# Patient Record
Sex: Female | Born: 1955 | Race: White | Hispanic: No | Marital: Married | Smoking: Never smoker
Health system: Southern US, Community
[De-identification: ages and names within clinical notes are randomized; demographics above are authoritative.]

## PROBLEM LIST (undated history)

## (undated) DIAGNOSIS — K589 Irritable bowel syndrome without diarrhea: Secondary | ICD-10-CM

## (undated) DIAGNOSIS — K5730 Diverticulosis of large intestine without perforation or abscess without bleeding: Secondary | ICD-10-CM

## (undated) DIAGNOSIS — Z800 Family history of malignant neoplasm of digestive organs: Secondary | ICD-10-CM

## (undated) HISTORY — PX: HEMORRHOID SURGERY: SHX153

## (undated) HISTORY — DX: Irritable bowel syndrome, unspecified: K58.9

## (undated) HISTORY — PX: OOPHORECTOMY: SHX86

## (undated) HISTORY — PX: FOOT SURGERY: SHX648

---

## 1998-01-19 ENCOUNTER — Encounter: Admission: RE | Admit: 1998-01-19 | Discharge: 1998-01-19 | Payer: Self-pay | Admitting: Sports Medicine

## 1998-02-05 ENCOUNTER — Encounter: Admission: RE | Admit: 1998-02-05 | Discharge: 1998-05-06 | Payer: Self-pay

## 1999-01-27 ENCOUNTER — Other Ambulatory Visit: Admission: RE | Admit: 1999-01-27 | Discharge: 1999-01-27 | Payer: Self-pay | Admitting: Obstetrics and Gynecology

## 1999-05-24 ENCOUNTER — Ambulatory Visit (HOSPITAL_COMMUNITY): Admission: RE | Admit: 1999-05-24 | Discharge: 1999-05-24 | Payer: Self-pay | Admitting: Obstetrics and Gynecology

## 1999-05-24 ENCOUNTER — Encounter: Payer: Self-pay | Admitting: Obstetrics and Gynecology

## 1999-05-27 ENCOUNTER — Ambulatory Visit (HOSPITAL_COMMUNITY): Admission: RE | Admit: 1999-05-27 | Discharge: 1999-05-27 | Payer: Self-pay | Admitting: Obstetrics and Gynecology

## 1999-05-27 ENCOUNTER — Encounter: Payer: Self-pay | Admitting: Obstetrics and Gynecology

## 1999-05-28 ENCOUNTER — Ambulatory Visit (HOSPITAL_COMMUNITY): Admission: RE | Admit: 1999-05-28 | Discharge: 1999-05-28 | Payer: Self-pay | Admitting: Internal Medicine

## 1999-05-28 ENCOUNTER — Encounter: Payer: Self-pay | Admitting: Internal Medicine

## 1999-06-30 HISTORY — PX: LAPAROTOMY: SHX154

## 1999-07-12 ENCOUNTER — Ambulatory Visit (HOSPITAL_COMMUNITY): Admission: RE | Admit: 1999-07-12 | Discharge: 1999-07-12 | Payer: Self-pay | Admitting: Obstetrics and Gynecology

## 1999-07-12 ENCOUNTER — Encounter (INDEPENDENT_AMBULATORY_CARE_PROVIDER_SITE_OTHER): Payer: Self-pay

## 1999-10-15 ENCOUNTER — Encounter: Admission: RE | Admit: 1999-10-15 | Discharge: 2000-01-13 | Payer: Self-pay | Admitting: Anesthesiology

## 1999-11-23 ENCOUNTER — Encounter: Payer: Self-pay | Admitting: Internal Medicine

## 2000-01-28 ENCOUNTER — Encounter: Payer: Self-pay | Admitting: Internal Medicine

## 2000-01-28 ENCOUNTER — Ambulatory Visit (HOSPITAL_COMMUNITY): Admission: RE | Admit: 2000-01-28 | Discharge: 2000-01-28 | Payer: Self-pay | Admitting: Internal Medicine

## 2000-02-16 ENCOUNTER — Encounter: Payer: Self-pay | Admitting: Gastroenterology

## 2000-02-16 ENCOUNTER — Ambulatory Visit (HOSPITAL_COMMUNITY): Admission: RE | Admit: 2000-02-16 | Discharge: 2000-02-16 | Payer: Self-pay | Admitting: Gastroenterology

## 2000-02-23 ENCOUNTER — Encounter: Payer: Self-pay | Admitting: Internal Medicine

## 2000-02-23 ENCOUNTER — Ambulatory Visit (HOSPITAL_COMMUNITY): Admission: RE | Admit: 2000-02-23 | Discharge: 2000-02-23 | Payer: Self-pay | Admitting: Internal Medicine

## 2000-04-07 ENCOUNTER — Other Ambulatory Visit: Admission: RE | Admit: 2000-04-07 | Discharge: 2000-04-07 | Payer: Self-pay | Admitting: Obstetrics and Gynecology

## 2000-05-03 ENCOUNTER — Encounter: Payer: Self-pay | Admitting: Internal Medicine

## 2000-05-03 ENCOUNTER — Ambulatory Visit (HOSPITAL_COMMUNITY): Admission: RE | Admit: 2000-05-03 | Discharge: 2000-05-03 | Payer: Self-pay | Admitting: Internal Medicine

## 2000-07-03 ENCOUNTER — Ambulatory Visit (HOSPITAL_COMMUNITY): Admission: RE | Admit: 2000-07-03 | Discharge: 2000-07-03 | Payer: Self-pay | Admitting: Obstetrics and Gynecology

## 2000-07-03 ENCOUNTER — Encounter: Payer: Self-pay | Admitting: Obstetrics and Gynecology

## 2001-04-05 ENCOUNTER — Other Ambulatory Visit: Admission: RE | Admit: 2001-04-05 | Discharge: 2001-04-05 | Payer: Self-pay | Admitting: Obstetrics and Gynecology

## 2001-07-06 ENCOUNTER — Encounter: Payer: Self-pay | Admitting: Obstetrics and Gynecology

## 2001-07-06 ENCOUNTER — Encounter: Admission: RE | Admit: 2001-07-06 | Discharge: 2001-07-06 | Payer: Self-pay | Admitting: Family Medicine

## 2001-07-06 ENCOUNTER — Ambulatory Visit (HOSPITAL_COMMUNITY): Admission: RE | Admit: 2001-07-06 | Discharge: 2001-07-06 | Payer: Self-pay | Admitting: Obstetrics and Gynecology

## 2001-08-29 HISTORY — PX: STAPEDECTOMY: SHX2435

## 2001-08-29 HISTORY — PX: ABDOMINAL HYSTERECTOMY: SHX81

## 2001-11-26 ENCOUNTER — Observation Stay (HOSPITAL_COMMUNITY): Admission: RE | Admit: 2001-11-26 | Discharge: 2001-11-27 | Payer: Self-pay | Admitting: Obstetrics and Gynecology

## 2001-11-26 ENCOUNTER — Encounter (INDEPENDENT_AMBULATORY_CARE_PROVIDER_SITE_OTHER): Payer: Self-pay | Admitting: Specialist

## 2002-04-22 ENCOUNTER — Encounter: Payer: Self-pay | Admitting: Obstetrics and Gynecology

## 2002-04-22 ENCOUNTER — Encounter: Admission: RE | Admit: 2002-04-22 | Discharge: 2002-04-22 | Payer: Self-pay | Admitting: Obstetrics and Gynecology

## 2002-08-06 ENCOUNTER — Ambulatory Visit (HOSPITAL_COMMUNITY): Admission: RE | Admit: 2002-08-06 | Discharge: 2002-08-06 | Payer: Self-pay | Admitting: Obstetrics and Gynecology

## 2002-08-06 ENCOUNTER — Encounter: Payer: Self-pay | Admitting: Obstetrics and Gynecology

## 2003-01-09 ENCOUNTER — Other Ambulatory Visit: Admission: RE | Admit: 2003-01-09 | Discharge: 2003-01-09 | Payer: Self-pay | Admitting: Obstetrics and Gynecology

## 2003-05-28 ENCOUNTER — Encounter: Admission: RE | Admit: 2003-05-28 | Discharge: 2003-05-28 | Payer: Self-pay | Admitting: Internal Medicine

## 2003-05-28 ENCOUNTER — Encounter: Payer: Self-pay | Admitting: Internal Medicine

## 2003-09-03 ENCOUNTER — Ambulatory Visit (HOSPITAL_COMMUNITY): Admission: RE | Admit: 2003-09-03 | Discharge: 2003-09-03 | Payer: Self-pay | Admitting: Obstetrics and Gynecology

## 2004-02-23 ENCOUNTER — Other Ambulatory Visit: Admission: RE | Admit: 2004-02-23 | Discharge: 2004-02-23 | Payer: Self-pay | Admitting: Obstetrics and Gynecology

## 2004-07-02 ENCOUNTER — Encounter: Admission: RE | Admit: 2004-07-02 | Discharge: 2004-07-02 | Payer: Self-pay | Admitting: General Surgery

## 2004-09-17 ENCOUNTER — Ambulatory Visit (HOSPITAL_COMMUNITY): Admission: RE | Admit: 2004-09-17 | Discharge: 2004-09-17 | Payer: Self-pay | Admitting: Obstetrics and Gynecology

## 2004-11-29 ENCOUNTER — Ambulatory Visit: Payer: Self-pay | Admitting: Internal Medicine

## 2005-03-02 ENCOUNTER — Other Ambulatory Visit: Admission: RE | Admit: 2005-03-02 | Discharge: 2005-03-02 | Payer: Self-pay | Admitting: Obstetrics and Gynecology

## 2005-05-04 ENCOUNTER — Ambulatory Visit: Payer: Self-pay | Admitting: Sports Medicine

## 2005-05-04 ENCOUNTER — Encounter: Admission: RE | Admit: 2005-05-04 | Discharge: 2005-05-04 | Payer: Self-pay | Admitting: Sports Medicine

## 2005-05-24 ENCOUNTER — Ambulatory Visit: Payer: Self-pay | Admitting: Sports Medicine

## 2005-09-28 ENCOUNTER — Ambulatory Visit (HOSPITAL_COMMUNITY): Admission: RE | Admit: 2005-09-28 | Discharge: 2005-09-28 | Payer: Self-pay | Admitting: Obstetrics and Gynecology

## 2005-12-09 ENCOUNTER — Ambulatory Visit: Payer: Self-pay | Admitting: Internal Medicine

## 2006-07-24 ENCOUNTER — Ambulatory Visit: Payer: Self-pay | Admitting: Internal Medicine

## 2006-08-09 ENCOUNTER — Ambulatory Visit: Payer: Self-pay | Admitting: Internal Medicine

## 2006-08-29 LAB — HM COLONOSCOPY

## 2006-10-24 ENCOUNTER — Ambulatory Visit (HOSPITAL_COMMUNITY): Admission: RE | Admit: 2006-10-24 | Discharge: 2006-10-24 | Payer: Self-pay | Admitting: Obstetrics and Gynecology

## 2006-12-09 ENCOUNTER — Emergency Department (HOSPITAL_COMMUNITY): Admission: EM | Admit: 2006-12-09 | Discharge: 2006-12-09 | Payer: Self-pay | Admitting: Emergency Medicine

## 2006-12-11 ENCOUNTER — Ambulatory Visit: Payer: Self-pay | Admitting: Internal Medicine

## 2006-12-15 ENCOUNTER — Encounter: Admission: RE | Admit: 2006-12-15 | Discharge: 2006-12-15 | Payer: Self-pay | Admitting: Internal Medicine

## 2006-12-20 ENCOUNTER — Ambulatory Visit: Payer: Self-pay | Admitting: Internal Medicine

## 2006-12-22 ENCOUNTER — Ambulatory Visit: Payer: Self-pay | Admitting: Sports Medicine

## 2006-12-26 ENCOUNTER — Ambulatory Visit: Payer: Self-pay | Admitting: Internal Medicine

## 2006-12-31 ENCOUNTER — Encounter: Admission: RE | Admit: 2006-12-31 | Discharge: 2006-12-31 | Payer: Self-pay | Admitting: Internal Medicine

## 2007-01-12 ENCOUNTER — Ambulatory Visit (HOSPITAL_COMMUNITY): Admission: RE | Admit: 2007-01-12 | Discharge: 2007-01-12 | Payer: Self-pay | Admitting: Internal Medicine

## 2007-02-10 ENCOUNTER — Ambulatory Visit: Payer: Self-pay | Admitting: Family Medicine

## 2007-02-28 ENCOUNTER — Ambulatory Visit: Payer: Self-pay | Admitting: Internal Medicine

## 2007-05-12 IMAGING — CT CT PELVIS W/O CM
2 of 4 series · 17 of 46 positions shown, 19 images · IV contrast (agent unspecified)
Comparison: none

CLINICAL DATA: 50 year-old female with persistent and worsening right flank pain.  History of hysterectomy.  
ABDOMEN CT WITHOUT CONTRAST:
TECHNIQUE: Multidetector CT imaging of the abdomen was performed following the standard protocol without IV contrast.
TECHNIQUE: Multidetector CT imaging of the pelvis was performed following the standard protocol without IV contrast.

[Series 2: abd/pelv w/o 5.0 b31f st · axial · non-contrast · 0.59mm/px · z∈[-420,-45]mm · 14 of 83 slices shown, 16 images]
[im 4/83  soft-tissue]
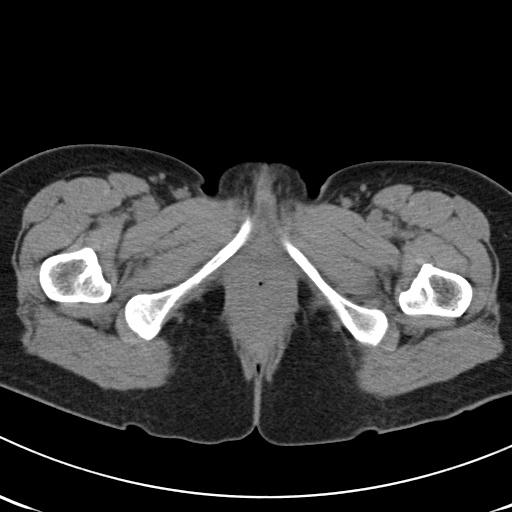
[im 4/83  bone]
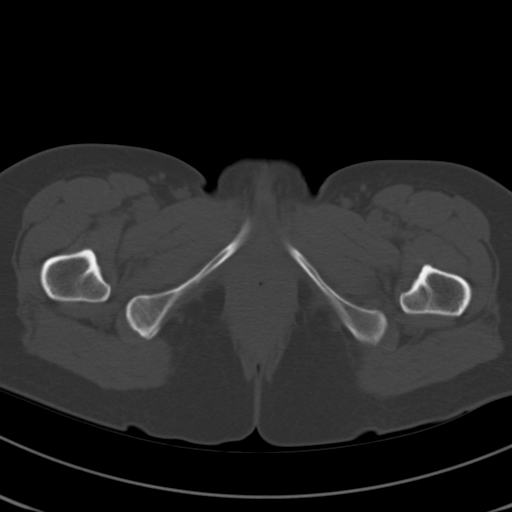
[im 11/83  soft-tissue]
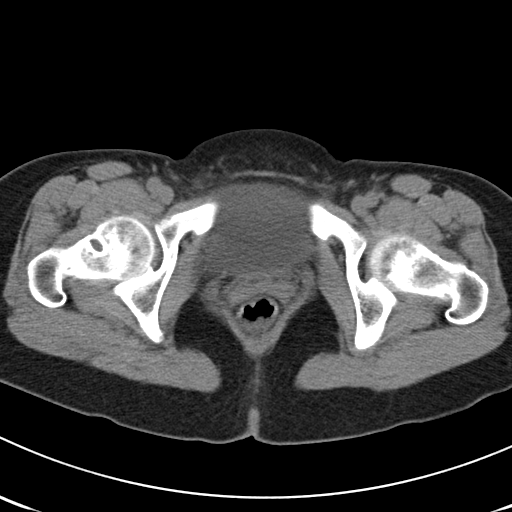
[im 18/83  soft-tissue]
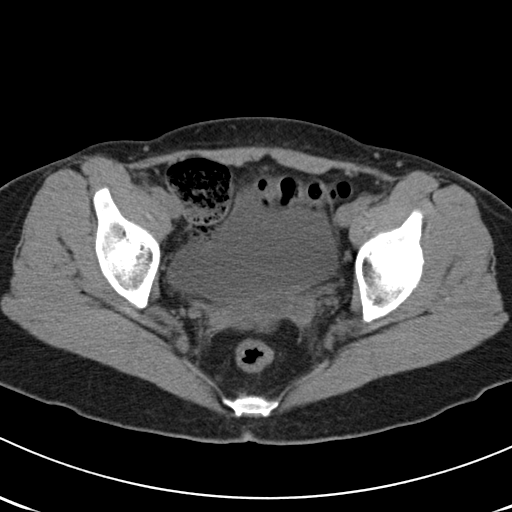
[im 21/83  soft-tissue]
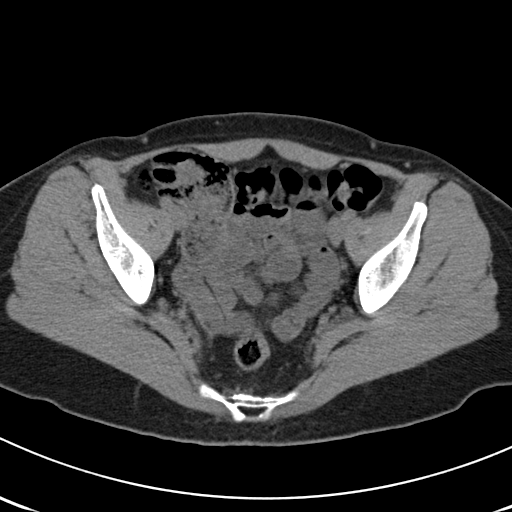
[im 28/83  soft-tissue]
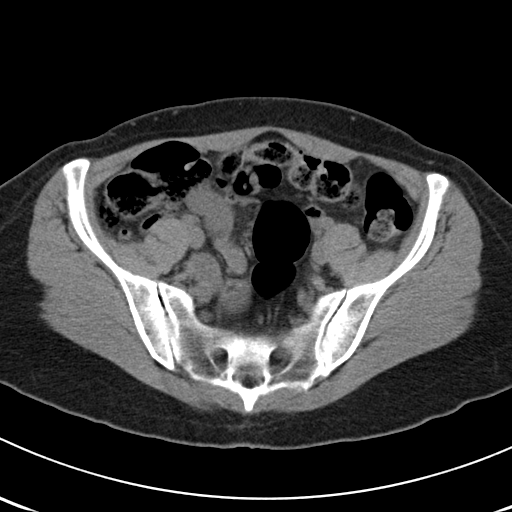
[im 35/83  soft-tissue]
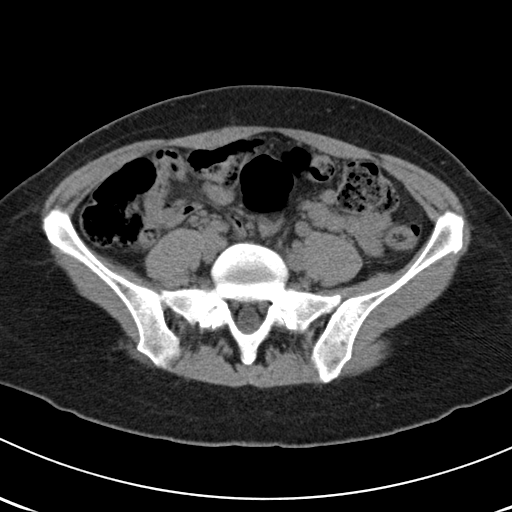
[im 38/83  soft-tissue]
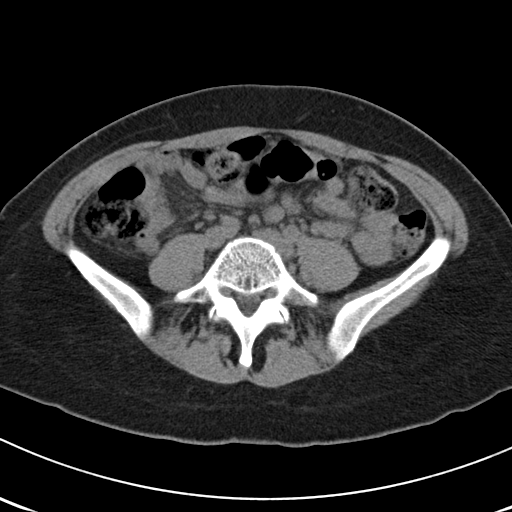
[im 45/83  soft-tissue]
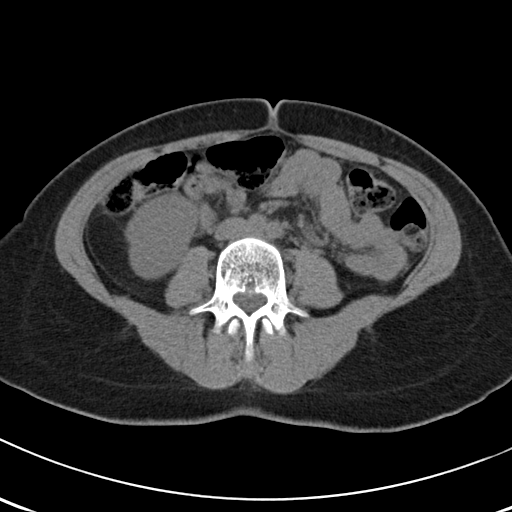
[im 48/83  soft-tissue]
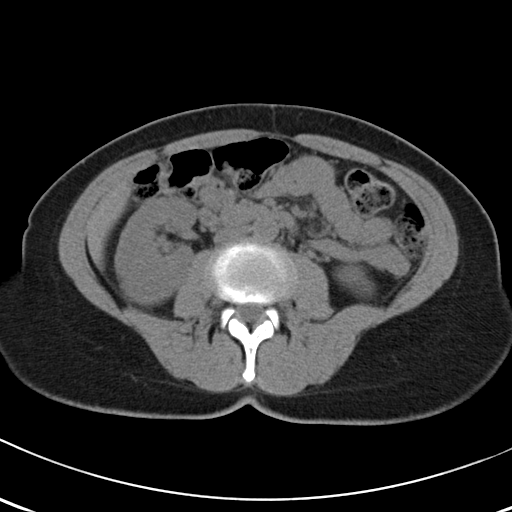
[im 48/83  bone]
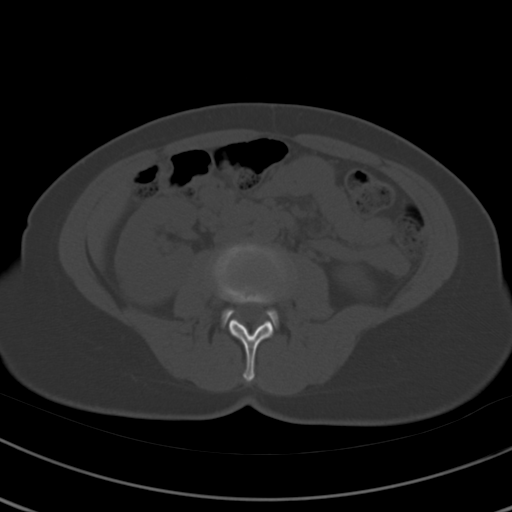
[im 55/83  soft-tissue]
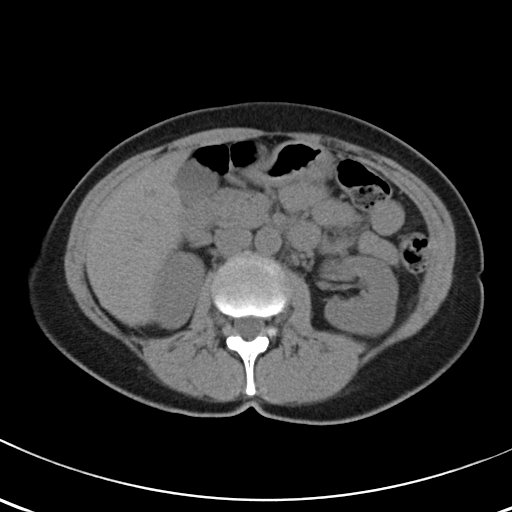
[im 62/83  soft-tissue]
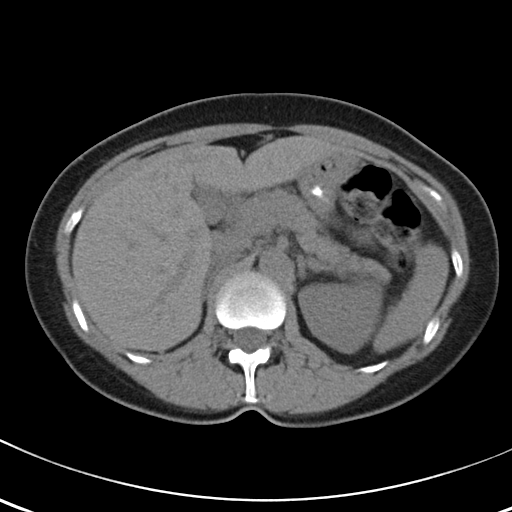
[im 65/83  soft-tissue]
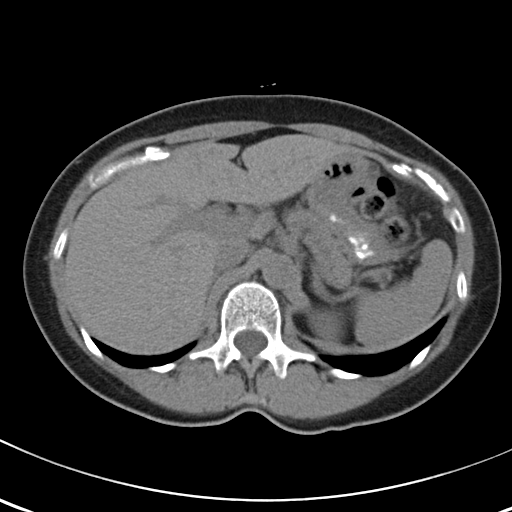
[im 72/83  soft-tissue]
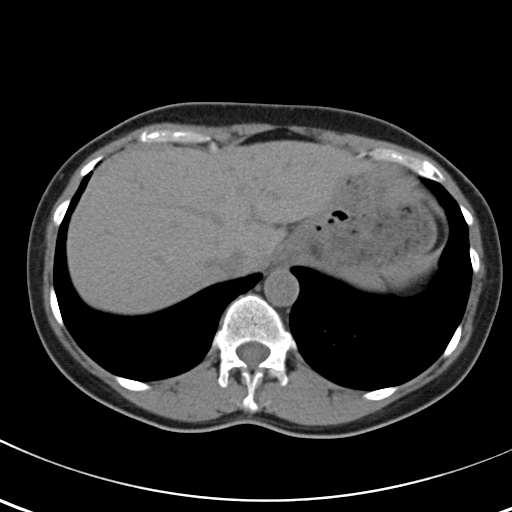
[im 79/83  soft-tissue]
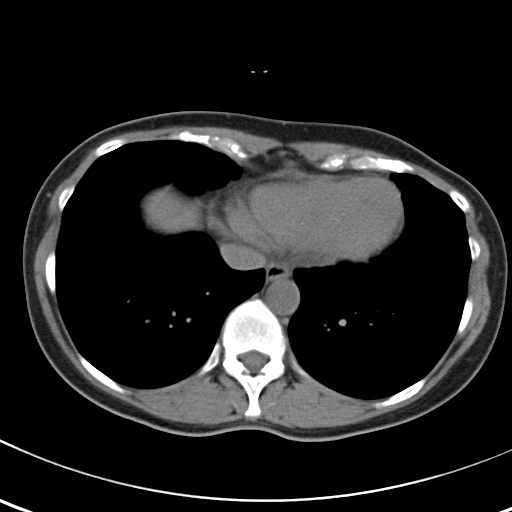

[Series 602: cor ab/pe · coronal · 0.81mm/px · 3 of 39 slices shown]
[im 13/39  soft-tissue]
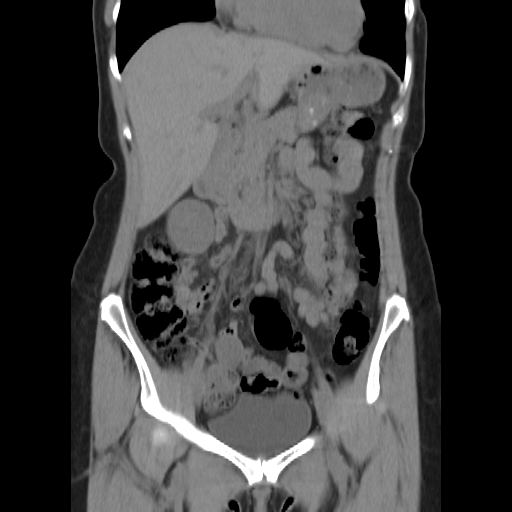
[im 17/39  soft-tissue]
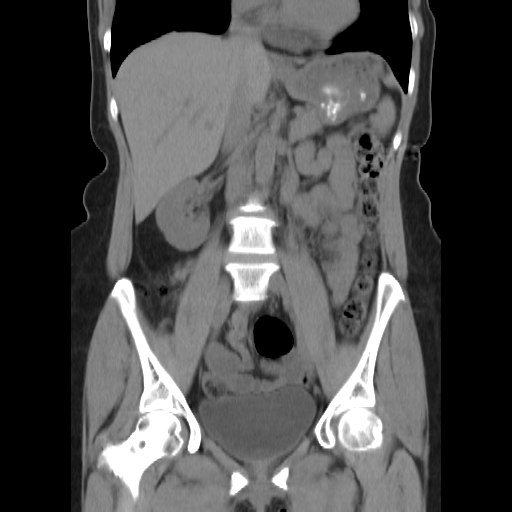
[im 22/39  soft-tissue]
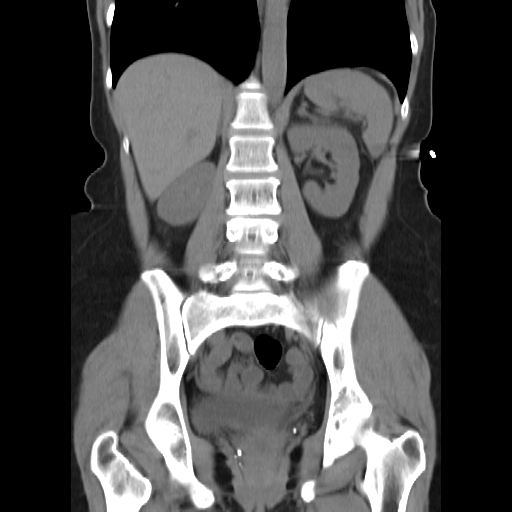

[17 of 46 positions shown; findings below may reference images not displayed]

FINDINGS: Clear lung bases.  No pericardial or pleural effusion.  Normal heart size.
In the abdomen, the kidneys demonstrate no definite hydronephrosis, obstructive uropathy, upper urinary tract stones, or ureteral dilatation.  The liver, bile ducts, gallbladder, adrenal glands, spleen, and pancreas are unremarkable for a noncontrast exam.  No acute abdominal fluid collection, ascites, free fluid, inflammation, definite adenopathy, bowel obstruction or free air.  In the right lower quadrant, the appendix is prominent in size but no significant inflammation or free fluid to suggest definite appendicitis.
IMPRESSION: 1.  No upper urinary tract calculi or acute obstructive uropathy.  
2.  No definite acute finding on noncontrast CT.
PELVIS CT WITHOUT CONTRAST:
FINDINGS: No definite distal ureteral calculus, obstruction, dilatation, or UVJ abnormality.  Patient is status post hysterectomy.  Pelvic calcifications are likely venous phleboliths.  No atherosclerotic disease.
IMPRESSION: 1.  No distal urinary tract calculi.  Status post hysterectomy.  
2.  No acute intrapelvic finding.

## 2007-07-06 ENCOUNTER — Ambulatory Visit: Payer: Self-pay | Admitting: Internal Medicine

## 2007-10-03 ENCOUNTER — Telehealth: Payer: Self-pay | Admitting: Internal Medicine

## 2007-10-04 ENCOUNTER — Ambulatory Visit: Payer: Self-pay | Admitting: Internal Medicine

## 2007-10-31 ENCOUNTER — Ambulatory Visit (HOSPITAL_COMMUNITY): Admission: RE | Admit: 2007-10-31 | Discharge: 2007-10-31 | Payer: Self-pay | Admitting: Obstetrics and Gynecology

## 2007-11-01 ENCOUNTER — Telehealth (INDEPENDENT_AMBULATORY_CARE_PROVIDER_SITE_OTHER): Payer: Self-pay | Admitting: *Deleted

## 2007-12-05 ENCOUNTER — Telehealth (INDEPENDENT_AMBULATORY_CARE_PROVIDER_SITE_OTHER): Payer: Self-pay | Admitting: *Deleted

## 2008-01-10 ENCOUNTER — Ambulatory Visit: Payer: Self-pay | Admitting: Internal Medicine

## 2008-01-10 LAB — CONVERTED CEMR LAB: Rapid Strep: NEGATIVE

## 2008-01-14 ENCOUNTER — Telehealth: Payer: Self-pay | Admitting: Internal Medicine

## 2008-05-09 ENCOUNTER — Ambulatory Visit: Payer: Self-pay | Admitting: Sports Medicine

## 2008-06-05 ENCOUNTER — Ambulatory Visit: Payer: Self-pay | Admitting: Internal Medicine

## 2008-06-16 ENCOUNTER — Encounter: Payer: Self-pay | Admitting: Internal Medicine

## 2008-10-21 ENCOUNTER — Telehealth: Payer: Self-pay | Admitting: Internal Medicine

## 2008-10-22 ENCOUNTER — Ambulatory Visit: Payer: Self-pay | Admitting: Internal Medicine

## 2008-10-28 ENCOUNTER — Telehealth: Payer: Self-pay | Admitting: Internal Medicine

## 2008-11-25 ENCOUNTER — Ambulatory Visit (HOSPITAL_COMMUNITY): Admission: RE | Admit: 2008-11-25 | Discharge: 2008-11-25 | Payer: Self-pay | Admitting: Obstetrics and Gynecology

## 2008-12-20 ENCOUNTER — Encounter: Payer: Self-pay | Admitting: Internal Medicine

## 2009-05-13 ENCOUNTER — Ambulatory Visit: Payer: Self-pay | Admitting: Internal Medicine

## 2009-05-13 DIAGNOSIS — M7710 Lateral epicondylitis, unspecified elbow: Secondary | ICD-10-CM | POA: Insufficient documentation

## 2009-05-28 ENCOUNTER — Ambulatory Visit: Payer: Self-pay | Admitting: Internal Medicine

## 2009-06-08 ENCOUNTER — Ambulatory Visit: Payer: Self-pay | Admitting: Internal Medicine

## 2009-07-10 ENCOUNTER — Encounter: Payer: Self-pay | Admitting: Internal Medicine

## 2009-08-10 ENCOUNTER — Encounter (INDEPENDENT_AMBULATORY_CARE_PROVIDER_SITE_OTHER): Payer: Self-pay | Admitting: *Deleted

## 2009-08-29 HISTORY — PX: ORIF ELBOW FRACTURE: SUR928

## 2009-08-29 LAB — CONVERTED CEMR LAB: Pap Smear: NORMAL

## 2009-12-10 ENCOUNTER — Ambulatory Visit: Payer: Self-pay | Admitting: Internal Medicine

## 2009-12-10 DIAGNOSIS — M549 Dorsalgia, unspecified: Secondary | ICD-10-CM | POA: Insufficient documentation

## 2009-12-21 ENCOUNTER — Telehealth: Payer: Self-pay | Admitting: Internal Medicine

## 2010-01-13 ENCOUNTER — Ambulatory Visit (HOSPITAL_COMMUNITY): Admission: RE | Admit: 2010-01-13 | Discharge: 2010-01-13 | Payer: Self-pay | Admitting: Obstetrics and Gynecology

## 2010-04-29 DIAGNOSIS — S42409A Unspecified fracture of lower end of unspecified humerus, initial encounter for closed fracture: Secondary | ICD-10-CM | POA: Insufficient documentation

## 2010-05-28 ENCOUNTER — Telehealth: Payer: Self-pay | Admitting: Internal Medicine

## 2010-06-26 ENCOUNTER — Emergency Department (HOSPITAL_COMMUNITY): Admission: EM | Admit: 2010-06-26 | Discharge: 2010-06-26 | Payer: Self-pay | Admitting: Emergency Medicine

## 2010-07-14 ENCOUNTER — Ambulatory Visit: Payer: Self-pay | Admitting: Internal Medicine

## 2010-07-26 ENCOUNTER — Telehealth: Payer: Self-pay | Admitting: Internal Medicine

## 2010-07-29 LAB — HM PAP SMEAR

## 2010-08-11 ENCOUNTER — Ambulatory Visit: Payer: Self-pay | Admitting: Internal Medicine

## 2010-08-11 ENCOUNTER — Encounter: Payer: Self-pay | Admitting: Internal Medicine

## 2010-09-15 ENCOUNTER — Encounter: Payer: Self-pay | Admitting: Internal Medicine

## 2010-09-19 ENCOUNTER — Encounter: Payer: Self-pay | Admitting: Internal Medicine
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
## 2010-09-28 NOTE — Progress Notes (Signed)
Summary: wants bone density  Phone Note Call from Patient Call back at Home Phone [phone]   Caller: Patient---live call Summary of Call: Wants an order to have a bone density test. gyn normal does this, but is unable to get in before year is up. Initial call taken by: Warnell Forester,  July 26, 2010 1:46 PM  Follow-up for Phone Call        ok Follow-up by: Birdie Sons MD,  July 26, 2010 2:28 PM  Additional Follow-up for Phone Call Additional follow up Details #1::        will go on 08-11-2010 Additional Follow-up by: Warnell Forester,  July 26, 2010 2:45 PM

## 2010-09-28 NOTE — Assessment & Plan Note (Signed)
Summary: consult re: tendonitis/pt req cortisone inj/cjr   Vital Signs:  Patient profile:   55 year old female Weight:      118 pounds Temp:     98.1 degrees F oral Pulse rate:   60 / minute Pulse rhythm:   regular Resp:     12 per minute BP sitting:   120 / 76  (left arm) Cuff size:   regular  Vitals Entered By: Gladis Riffle, RN (December 10, 2009 11:12 AM) CC: discuss right elbow tendonitis and possible steroid inj,plus  lower back pain Is Patient Diabetic? No Comments needs refills all but anusol cream   CC:  discuss right elbow tendonitis and possible steroid inj and plus  lower back pain.  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 Once Daily 2)  Calcium Citrate Malate-Vit D 250-100 Mg-Unit Tabs (Calcium-Vitamin D) .... Three Times A Day 3)  Black Cohosh 40 Mg Caps (Black Cohosh) .... Two Times A Day 4)  Anusol-Hc 2.5 % Crea (Hydrocortisone) .... Apply To Rectum Prn 5)  Magnesium Oxide 250 Mg Tabs (Magnesium Oxide) .... Once Daily 6)  Centrum  Tabs (Multiple Vitamins-Minerals) .... Take 1 Tablet By Mouth Once A Day  Allergies (verified): No Known Drug Allergies  Past History:  Past Medical History: Last updated: 03/08/2007 endometriosis  2000 IBS  Past Surgical History: Last updated: 03/08/2007 Hemorrhoidectomy  "growth on hemorroid"  1997 Hysterectomy  2003 Laparotomy-exploratory, for endometriosis, Lupron  11/00 Oophorectomy  2003 L ear stapedectomy  2003  Risk Factors: Smoking Status: never (12/10/2009)  Physical Exam  General:  alert and  well-developed.   Msk:  full range of motion of both elbows.  Tenderness to the right lateral epicondyle to palpation. Extremities:  full range of motion of both hips.  Deep tendon reflexes are intact bilaterally in the lower extremity.   Impression & Recommendations:  Problem # 1:  BACK PAIN (ICD-724.5) exercise reviewed exercises with the patient.  She will concentrate on stretching.  May need further evaluation with physical therapy.  Problem # 2:  LATERAL EPICONDYLITIS (ICD-726.32)  injection---discussed  (injection) she has had previous success with injection.  She requests the same. risks and benefits discussed.  She gives verbal consent to proceed.  The area was prepped and draped in a sterile fashion.  1% lidocaine for anesthesia.  20 mg Depo-Medrol injected around the lateral epicondyle at the point of maximal tenderness.    Orders: Joint Aspirate / Injection, Intermediate (20605)  Complete Medication List: 1)  Zyrtec Allergy 10 Mg Tabs (Cetirizine hcl) .... Once daily 2)  Calcium Citrate Malate-vit D 250-100 Mg-unit Tabs (Calcium-vitamin d) .... Three times a day 3)  Black Cohosh 40 Mg Caps (Black cohosh) .... Two times a day 4)  Anusol-hc 2.5 % Crea (Hydrocortisone) .... Apply to rectum prn 5)  Magnesium Oxide 250 Mg Tabs (Magnesium oxide) .... Once daily 6)  Centrum Tabs (Multiple vitamins-minerals) .... Take 1 tablet by mouth once a day  Other Orders: Tdap => 41yrs IM (16109) Admin 1st Vaccine (60454)  Preventive Care Screening  Colonoscopy:    Date:  08/30/2007  Next Due:  08/2012    Results:  normal-pts   Mammogram:    Date:  08/29/2009    Next Due:  08/2011    Results:  normal-pt's report   Pap Smear:    Date:  08/29/2009    Next Due:  08/2012    Results:  normal-pt's report   Prescriptions: CENTRUM  TABS (MULTIPLE VITAMINS-MINERALS) Take 1 tablet by mouth once a day  #90 x 1 year   Entered by:   Gladis Riffle, RN   Authorized by:   Birdie Sons MD    Signed by:   Gladis Riffle, RN on 12/10/2009   Method used:   Print then Give to Patient   RxID:   [id number] MAGNESIUM OXIDE 250 MG TABS (MAGNESIUM OXIDE) once daily  #90 x 1 year   Entered by:   Gladis Riffle, RN   Authorized by:   Birdie Sons MD   Signed by:   Gladis Riffle, RN on 12/10/2009   Method used:   Print then Give to Patient   RxID:   [id number] BLACK COHOSH 40 MG CAPS (BLACK COHOSH) two times a day  #180 x 1 year   Entered by:   Gladis Riffle, RN   Authorized by:   Birdie Sons MD   Signed by:   Gladis Riffle, RN on 12/10/2009   Method used:   Print then Give to Patient   RxID:   [id number] CALCIUM CITRATE MALATE-VIT D 250-100 MG-UNIT TABS (CALCIUM-VITAMIN D) three times a day  #270 x 1 year   Entered by:   Gladis Riffle, RN   Authorized by:   Birdie Sons MD   Signed by:   Gladis Riffle, RN on 12/10/2009   Method used:   Print then Give to Patient   RxID:   [id number] ZYRTEC ALLERGY 10 MG TABS (CETIRIZINE HCL) once daily  #90 x 1 year   Entered by:   Gladis Riffle, RN   Authorized by:   Birdie Sons MD   Signed by:   Gladis Riffle, RN on 12/10/2009   Method used:   Print then Give to Patient   RxID:   [id number]    Immunizations Administered:  Tetanus Vaccine:    Vaccine Type: Tdap    Site: left deltoid    Mfr: GlaxoSmithKline    Dose: 0.5 ml    Route: IM    Given by: Gladis Riffle, RN    Exp. Date: 11/21/2011    Lot #: [id number]

## 2010-09-28 NOTE — Assessment & Plan Note (Signed)
Summary: flu shot/cjr   Nurse Visit   Allergies: No Known Drug Allergies  Review of Systems       Flu Vaccine Consent Questions     Do you have a history of severe allergic reactions to this vaccine? no    Any prior history of allergic reactions to egg and/or gelatin? no    Do you have a sensitivity to the preservative Thimersol? no    Do you have a past history of Guillan-Barre Syndrome? no    Do you currently have an acute febrile illness? no    Have you ever had a severe reaction to latex? no    Vaccine information given and explained to patient? yes    Are you currently pregnant? no    Lot Number:[id number]   Exp Date:02/25/2010   Site Given  Left Deltoid IM    Orders Added: 1)  Admin 1st Vaccine [90471] 2)  Flu Vaccine 57yrs + [16109]

## 2010-09-28 NOTE — Progress Notes (Signed)
Summary: need papers faxed  Phone Note Call from Patient Call back at Home Phone [phone]   Caller: Patient--live call Summary of Call: need excercises for her back. Dr Cato Mulligan was going to give them to her last week at her ov. Fax them to her at [phone]. Initial call taken by: Warnell Forester,  December 21, 2009 11:07 AM  Follow-up for Phone Call        done. Follow-up by: Gladis Riffle, RN,  December 21, 2009 1:12 PM

## 2010-09-28 NOTE — Letter (Signed)
Summary: Health Screening Report  Health Screening Report   Imported By: Maryln Gottron 12/11/2009 14:24:55  _____________________________________________________________________  External Attachment:    Type:   Image     Comment:   External Document

## 2010-09-30 NOTE — Miscellaneous (Signed)
Summary: BONE DENSITY  Clinical Lists Changes  Orders: Added new Test order of T-Bone Densitometry (77080) - Signed Added new Test order of T-Lumbar Vertebral Assessment (77082) - Signed 

## 2010-10-06 NOTE — Letter (Signed)
Summary: [facility]   Imported By: Maryln Gottron 09/30/2010 09:24:05  _____________________________________________________________________  External Attachment:    Type:   Image     Comment:   External Document

## 2011-01-05 ENCOUNTER — Other Ambulatory Visit (HOSPITAL_COMMUNITY): Payer: Self-pay | Admitting: Obstetrics and Gynecology

## 2011-01-05 DIAGNOSIS — Z1231 Encounter for screening mammogram for malignant neoplasm of breast: Secondary | ICD-10-CM

## 2011-01-14 NOTE — Discharge Summary (Signed)
[facility]  Patient:    Monique Moore, Monique Moore Visit Number: [id number] MRN: [id number]          Service Type: DSU Location: 910A 9141 [id number] Attending Physician:  Soledad Gerlach Dictated by:   Guy Sandifer Arleta Creek, M.D. Admit Date:  11/26/2001 Disc. Date: 11/27/01                             Discharge Summary  ADMISSION DIAGNOSES: 1. Endometriosis. 2. Uterine fibroids.  DISCHARGE DIAGNOSES: 1. Endometriosis. 2. Uterine fibroids.  PROCEDURE:  On 11/26/01, laparoscopically assisted vaginal hysterectomy with bilateral salpingo-oophorectomy.  SURGEON:  Guy Sandifer. Arleta Creek, M.D.  ASSISTANT:  Marcelle Overlie, M.D.  ANESTHESIA:  General with endotracheal intubation.  ESTIMATED BLOOD LOSS:  150 cc. Dictated by:   Guy Sandifer Arleta Creek, M.D. Attending Physician:  Soledad Gerlach DD:  11/27/01 TD:  11/27/01 Job: [id number]

## 2011-01-14 NOTE — Discharge Summary (Signed)
[facility]  Patient:    Monique Moore, Monique Moore Visit Number: [id number] MRN: [id number]          Service Type: DSU Location: 910A 9141 01 Attending Physician:  Soledad Gerlach Dictated by:   Guy Sandifer Arleta Creek, M.D. Admit Date:  11/26/2001 Disc. Date: 11/27/01                             Discharge Summary  ADMISSION DIAGNOSES: 1. Endometriosis. 2. Uterine fibroids.  DISCHARGE DIAGNOSES: 1. Endometriosis. 2. Uterine fibroids.  PROCEDURE:  On 11/26/01, laparoscopically assisted vaginal hysterectomy with bilateral salpingo-oophorectomy.  REASON FOR ADMISSION:  The patient is a 55 year old married white female, G2, P2, with known uterine leiomyomata and endometriosis.  Details have been dictated in the history and physical.  She is admitted for surgical management.  HOSPITAL COURSE:  The patient undergoes the above procedure without complication.  Estimated blood loss is 150 cc.  On the evening of surgery, she is nauseated with liquids.  She is able to munch ice chips.  Vital signs remain stable, and she is afebrile.  Urine output is clear.  Her abdomen is soft and flat with somewhat reduced, but positive bowel sounds in all four quadrants.  Her nausea responds to medication.  On the day of discharge, the patient reports she had nausea through the night, but today is feeling much better.  She is tolerating a liquid tray, passing flatus, ambulating well, and voiding.  Vital signs are stable, and she remains afebrile.  Abdomen is soft with good bowel sounds.  White count is 14.7, hemoglobin 10.7.  The plan is to discharge after a regular diet for lunch.  CONDITION ON DISCHARGE:  Good.  DIET:  Regular as tolerated.  ACTIVITY:  No lifting, no operation of automobiles, no vaginal entry.  She is to call the office for problems, including, but not limited to temperature of 101 or greater, heavy vaginal bleeding, persistent nausea  or vomiting, or increasing pain.  DISCHARGE MEDICATIONS: 1. Percocet 5/325 mg #30 one or two p.o. q.6h. p.r.n. 2. Ibuprofen 600 mg p.o. q.6h. p.r.n. 3. Multivitamin daily. 4. She is to resume her Zelnorm.  FOLLOWUP:  In the office in two weeks. Dictated by:   Guy Sandifer Arleta Creek, M.D. Attending Physician:  Soledad Gerlach DD:  11/27/01 TD:  11/27/01 Job: [id number]

## 2011-01-14 NOTE — H&P (Signed)
[facility]  Patient:    Monique Moore, Monique Moore Visit Number: [id number] MRN: [id number]          Service Type: DSU Location: 910A 9141 [id number] Attending Physician:  Soledad Gerlach Dictated by:   Guy Sandifer Arleta Creek, M.D. Admit Date:  11/26/2001                           History and Physical  CHIEF COMPLAINT:              Irregular menses and pelvic pain.  HISTORY OF PRESENT ILLNESS:   The patient is a 55 year old married white female, G2, P2, with a known uterine leiomyomata and endometriosis.  She is status post laparoscopy with resection of endometriosis in November 2000. This was followed by six months of Depo-Lupron therapy.  She also has a known uterine leiomyomata measuring 1.5 cm in greatest dimension on ultrasound in January of this year.  A sonohistogram in January of this year was negative for endometrial masses; however, her menses have become progressively worse and more frequent.  After a careful discussion of the options, the patient is being admitted for laparoscopically assisted vaginal hysterectomy.  After further discussion, the patient also request bilateral salpingo-oophorectomy.  PAST MEDICAL HISTORY:         1. Endometriosis.                               2. Cervical dysplasia.                               3. Irritable bowel syndrome.                               4. Hemorrhoids.                               5. Recurrent cystitis.  PAST SURGICAL HISTORY:        1. Hemorrhoidectomy in 1997.                               2. Cystoscopy in 1982.                               3. Tonsillectomy in 1963.                               4. Laparoscopy in 2000.  MEDICATIONS:                  Zelnorm, vitamins and calcium.  ALLERGIES:                    CODEINE.  OBSTETRIC HISTORY:            Vaginal delivery x 2.  SOCIAL HISTORY:               The patient consumes alcohol on a social basis. Denies tobacco or drug  abuse.  REVIEW OF SYSTEMS:            As above.  FAMILY HISTORY:  Positive for chronic hypertension in mother, diabetes in father and brother, cerebrovascular accident in father.  PHYSICAL EXAMINATION:  VITAL SIGNS:                  Height 5 feet 3 inches.  Weight 115 pounds. Blood pressure 128/82.  NECK:                         Without thyromegaly.  LUNGS:                        Clear to auscultation.  HEART:                        Regular rate and rhythm.  BACK:                         Without CVA tenderness.  BREASTS:                      Without mass, track, or discharge.  ABDOMEN:                      Soft and nontender without masses.  PELVIC:                       Both vagina and cervix without lesion.  Uterus is upper normal size and nontender.  Adnexa is nontender without masses.  RECTAL:                       Without masses.  EXTREMITIES:                  Grossly within normal limits.  NEUROLOGICAL:                 Grossly within normal limits.  ASSESSMENT:                   Menometrorrhagia with known uterine leiomyomata and endometriosis.  PLAN:                         Laparoscopically assisted vaginal hysterectomy with bilateral salpingo-oophorectomy. Dictated by:   Guy Sandifer Arleta Creek, M.D. Attending Physician:  Soledad Gerlach DD:  11/23/01 TD:  11/23/01 Job: [id number]

## 2011-01-14 NOTE — Op Note (Signed)
[facility]  Patient:    Monique Moore, Monique Moore Visit Number: [id number] MRN: [id number]          Service Type: DSU Location: 9300 [id number] Attending Physician:  Soledad Gerlach Dictated by:   Guy Sandifer Arleta Creek, M.D. Proc. Date: 11/26/01 Admit Date:  11/26/2001                             Operative Report  PREOPERATIVE DIAGNOSES:       1. Endometriosis.                               2. Uterine fibroids.  POSTOPERATIVE DIAGNOSES:      1. Endometriosis.                               2. Uterine fibroids.  OPERATION:                    Laparoscopic-assisted vaginal hysterectomy with                               bilateral salpingo-oophorectomy.  SURGEON:                      Guy Sandifer. Arleta Creek, M.D.  ASSISTANT:                    Marcelle Overlie, M.D.  ANESTHESIA:                   General with endotracheal intubation.  ESTIMATED BLOOD LOSS:         150 cc.  INDICATIONS AND CONSENT:      This patient is a 55 year old married white female G2, P2, with known uterine leiomyomata and endometriosis.  The details are dictated in the History and Physical.  Laparoscopically-assisted vaginal hysterectomy was discussed with the patient.  Possible removal of both ovaries is discussed.  The patient states she definitely wants both ovaries removed. Potential risks and complications have been discussed including, but not limited to infection, bowel, bladder, or ureteral damage, bleeding requiring transfusion of blood products with possible transfusion reaction, HIV and hepatitis acquisition, DVT, PE, pneumonia, fistula formation, laparotomy, postoperative dyspareunia.  Issues of menopause and potential estrogen therapy or other treatments has been discussed.  All questions have been answered and consent is signed on the chart.  FINDINGS:                     Upper abdomen is normal.  Uterus is approximately six weeks in size.  Ovaries are without  adhesions bilaterally. Tubes are free bilaterally.  There is some white-type implants of endometriosis on the left pelvic side wall.  DESCRIPTION OF PROCEDURE:     The patient was taken to the operating room and placed in the dorsolithotomy position where general anesthesia was induced via endotracheal intubation.  She was then placed in the dorsolithotomy position where she was prepped abdominally and vaginally.  The bladder was straight catheterized.  A Hulka tenaculum was placed.  The uterus was manipulated and she was draped in a sterile fashion.  A small infraumbilical incision was then made and a 10-11 disposable trocar sleeve was placed on the first attempt without  difficulty.  Placement was verified with the laparoscope and no damage to surrounding structures was noted.  Pneumoperitoneum was induced to a small suprapubic and later left lower quadrant incision was made after careful transillumination and a 5 mm disposable trocar sleeves were then placed under direct visualization and without difficulty.  The above findings were noted. The cords and ureters is identified bilaterally and seemed to be clear of the area of surgery.  Then, using the 5 mm bipolar cautery cutting device through the operative laparoscope, the infundibulopelvic ligament followed by the mesosalpinx and the round ligament on the right side was taken down.  A similar procedure was carried on the left.  Good hemostasis is maintained. The midline incision is made in the vesicouterine peritoneum which is dissected bilaterally.  Good hemostasis is maintained.  Excess irrigation is removed, instruments are removed, and attention is turned to the vagina.  A weighted speculum was placed and the posterior cul-de-sac was entered sharply without difficulty.  The cervix is circumscribed with the scalpel and the mucosa is advanced sharply and bluntly.  Then, using the LigaSure instrument, the uterosacral ligament followed  by the bladder pillars are taken bilaterally.  Progressive bites were taken bilaterally above the level of the uterine vessels.  The fundus with adherent tubes and ovaries was delivered posteriorly, and the remaining pedicles are taken down with the LigaSure.  The uterosacral ligaments were then plicated to the vaginal cuff bilaterally with 0 Monocryl suture.  The uterosacral ligaments were then plicated in the midline with an additional suture.  The cuff was then closed with a figure-of-eight 0 Monocryl suture.  A Foley catheter was placed in the bladder and clear urine was noted.  Reinspection through the laparoscope reveals minor oozing at the vaginal cuff which was controlled with bipolar cautery.  Excess fluid is removed.  Pneumoperitoneum is reduced.  The inferior trocar sleeves are removed and no bleeding is noted.  Pneumoperitoneum was completely reduced.  The umbilical incision was closed with a 2-0 Vicryl suture in the deeper underlying layers with care being taken not to pick up any underlying structures.  The incisions were then injected with 0.5% plain Marcaine.  The skin edges were then reapproximated with Dermabond.  All counts are correct.  The patient is awakened and taken to the recovery room in stable condition. Dictated by:   Guy Sandifer Arleta Creek, M.D. Attending Physician:  Soledad Gerlach DD:  11/26/01 TD:  11/26/01 Job: [id number]

## 2011-01-14 NOTE — Assessment & Plan Note (Signed)
[facility]                                 ON-CALL NOTE   KARLENE, SOUTHARD                      MRN:          [id number]  DATE:12/09/2006                            DOB:          1956-03-24    Patient of Dr. Cato Mulligan   Phone#:  [phone]   Patient calling because she has severe right flank pain.  It started on  Tuesday.  It comes and goes, now it has gotten worse.  She has no fever,  chills, nausea, vomiting, or diarrhea.  She has no urinary tract  symptoms.  She has had TAH/BSO, but no other abdominal surgery.  She is  otherwise in good health.  She says her mother had symptoms similar to  this, and had to have her gallbladder removed.   Discussed clinical symptoms.  With the severe pain, it could be  gallbladder, but my suspicion would be most likely it is a kidney stone.  Advised her to come to the [facility] to be seen, get  urine, scans, etc., today.  Patient voiced understanding.     Jeffrey A. Tawanna Cooler, MD  Electronically Signed    JAT/MedQ  DD: 12/09/2006  DT: 12/09/2006  Job #: [id number]

## 2011-02-03 ENCOUNTER — Ambulatory Visit (HOSPITAL_COMMUNITY): Payer: BC Managed Care – PPO

## 2011-02-25 ENCOUNTER — Ambulatory Visit (HOSPITAL_COMMUNITY)
Admission: RE | Admit: 2011-02-25 | Discharge: 2011-02-25 | Disposition: A | Discharge status: Home / Self Care | Payer: BC Managed Care – PPO | Source: Ambulatory Visit | Attending: Obstetrics and Gynecology | Admitting: Obstetrics and Gynecology

## 2011-02-25 DIAGNOSIS — Z1231 Encounter for screening mammogram for malignant neoplasm of breast: Secondary | ICD-10-CM | POA: Insufficient documentation

## 2011-04-13 ENCOUNTER — Other Ambulatory Visit (INDEPENDENT_AMBULATORY_CARE_PROVIDER_SITE_OTHER): Payer: BC Managed Care – PPO

## 2011-04-13 DIAGNOSIS — Z0000 Encounter for general adult medical examination without abnormal findings: Secondary | ICD-10-CM

## 2011-04-13 LAB — BASIC METABOLIC PANEL
BUN: 13 mg/dL (ref 6–23)
CO2: 30 mEq/L (ref 19–32)
Calcium: 9.3 mg/dL (ref 8.4–10.5)
Creatinine, Ser: 0.8 mg/dL (ref 0.4–1.2)
GFR: 76.85 mL/min (ref >60.00)
Glucose, Bld: 103 mg/dL — ABNORMAL HIGH (ref 70–99)

## 2011-04-13 LAB — POCT URINALYSIS DIPSTICK
Bilirubin, UA: NEGATIVE
Glucose, UA: NEGATIVE
Ketones, UA: NEGATIVE
Nitrite, UA: NEGATIVE
Spec Grav, UA: 1.015
pH, UA: 8.5

## 2011-04-13 LAB — TSH: TSH: 2.46 u[IU]/mL (ref 0.35–5.50)

## 2011-04-13 LAB — CBC WITH DIFFERENTIAL/PLATELET
Basophils Absolute: 0 10*3/uL (ref 0.0–0.1)
Basophils Relative: 0.7 % (ref 0.0–3.0)
Eosinophils Absolute: 0.2 10*3/uL (ref 0.0–0.7)
Eosinophils Relative: 3.8 % (ref 0.0–5.0)
Lymphocytes Relative: 31.4 % (ref 12.0–46.0)
Lymphs Abs: 1.9 10*3/uL (ref 0.7–4.0)
MCHC: 33.6 g/dL (ref 30.0–36.0)
MCV: 91.8 fl (ref 78.0–100.0)
Monocytes Absolute: 0.6 10*3/uL (ref 0.1–1.0)
Monocytes Relative: 9.4 % (ref 3.0–12.0)
Neutrophils Relative %: 54.7 % (ref 43.0–77.0)
Platelets: 279 10*3/uL (ref 150.0–400.0)
RBC: 4.48 Mil/uL (ref 3.87–5.11)
WBC: 6 10*3/uL (ref 4.5–10.5)

## 2011-04-13 LAB — HEPATIC FUNCTION PANEL
ALT: 17 U/L (ref 0–35)
AST: 28 U/L (ref 0–37)
Albumin: 4.4 g/dL (ref 3.5–5.2)
Alkaline Phosphatase: 78 U/L (ref 39–117)
Bilirubin, Direct: 0 mg/dL (ref 0.0–0.3)
Total Bilirubin: 0.7 mg/dL (ref 0.3–1.2)
Total Protein: 7 g/dL (ref 6.0–8.3)

## 2011-04-13 LAB — LIPID PANEL
Total CHOL/HDL Ratio: 2
Triglycerides: 83 mg/dL (ref 0.0–149.0)

## 2011-04-25 ENCOUNTER — Other Ambulatory Visit (INDEPENDENT_AMBULATORY_CARE_PROVIDER_SITE_OTHER): Payer: BC Managed Care – PPO

## 2011-04-25 DIAGNOSIS — R300 Dysuria: Secondary | ICD-10-CM

## 2011-04-27 ENCOUNTER — Ambulatory Visit (INDEPENDENT_AMBULATORY_CARE_PROVIDER_SITE_OTHER): Payer: BC Managed Care – PPO | Admitting: Internal Medicine

## 2011-04-27 ENCOUNTER — Encounter: Payer: Self-pay | Admitting: Internal Medicine

## 2011-04-27 VITALS — BP 104/68 | HR 84 | Temp 97.7°F | Ht 62.75 in | Wt 118.0 lb

## 2011-04-27 DIAGNOSIS — Z0000 Encounter for general adult medical examination without abnormal findings: Secondary | ICD-10-CM

## 2011-04-27 LAB — URINE CULTURE: Colony Count: 6000

## 2011-04-27 NOTE — Progress Notes (Signed)
  Subjective:    Patient ID: Monique Moore, female    DOB: Feb 18, 1956, 55 y.o.   MRN: [id number]  HPI cpx  Past Medical History  Diagnosis Date  . Endometriosis 2000  . IBS (irritable bowel syndrome)    Past Surgical History  Procedure Date  . Hemorrhoid surgery   . Abdominal hysterectomy 2003  . Laparotomy 11/00  . Oophorectomy   . Stapedectomy 2003  . Orif elbow fracture 2011    performed in [place]    reports that she has never smoked. She does not have any smokeless tobacco history on file. She reports that she drinks about .6 ounces of alcohol per week. She reports that she does not use illicit drugs. family history includes Arthritis in her mother; Atrial fibrillation in her mother; Cerebral aneurysm in her father; Diabetes in her brother; Hypertension in her brother and mother; and Osteoporosis in her mother. No Known Allergies  Review of Systems  patient denies chest pain, shortness of breath, orthopnea. Denies lower extremity edema, abdominal pain, change in appetite, change in bowel movements. Patient denies rashes, musculoskeletal complaints. No other specific complaints in a complete review of systems.      Objective:   Physical Exam   Well-developed well-nourished female in no acute distress. HEENT exam atraumatic, normocephalic, extraocular muscles are intact. Neck is supple. No jugular venous distention no thyromegaly. Chest clear to auscultation without increased work of breathing. Cardiac exam S1 and S2 are regular. Abdominal exam active bowel sounds, soft, nontender. Extremities no edema. Neurologic exam she is alert without any motor sensory deficits. Gait is normal.     Assessment & Plan:  Well visit---health maintenance UTD Pyuria--culture negative---no treatment necessary  Need to F/U DEXA

## 2011-06-10 ENCOUNTER — Ambulatory Visit (INDEPENDENT_AMBULATORY_CARE_PROVIDER_SITE_OTHER): Payer: BC Managed Care – PPO

## 2011-06-10 DIAGNOSIS — Z23 Encounter for immunization: Secondary | ICD-10-CM

## 2012-05-23 ENCOUNTER — Encounter: Payer: Self-pay | Admitting: Family

## 2012-05-23 ENCOUNTER — Ambulatory Visit (INDEPENDENT_AMBULATORY_CARE_PROVIDER_SITE_OTHER): Payer: BC Managed Care – PPO | Admitting: Family

## 2012-05-23 VITALS — BP 112/70 | HR 72 | Temp 98.7°F | Wt 120.0 lb

## 2012-05-23 DIAGNOSIS — H9209 Otalgia, unspecified ear: Secondary | ICD-10-CM

## 2012-05-23 DIAGNOSIS — H6980 Other specified disorders of Eustachian tube, unspecified ear: Secondary | ICD-10-CM

## 2012-05-23 DIAGNOSIS — Z23 Encounter for immunization: Secondary | ICD-10-CM

## 2012-05-23 MED ORDER — FLUTICASONE PROPIONATE 50 MCG/ACT NA SUSP: 2.0000 | Freq: Every day | NASAL | Status: DC

## 2012-05-23 NOTE — Progress Notes (Signed)
Subjective:    Patient ID: Monique Moore, female    DOB: Jan 03, 1956, 56 y.o.   MRN: [id number]  HPI 56 year old white female, nonsmoker, patient of Dr. Cato Mulligan is in today with complaints of pain and decreased hearing in her right ear. She's had sneezing and nasal congestion since September 11. Symptoms were worse after her flight from [place]. She's been taken Zyrtec once daily. Denies any fever, muscle aches or pain, sinus pressure.   Review of Systems  Constitutional: Negative.   HENT: Positive for ear pain and postnasal drip. Negative for sore throat and sinus pressure.   Respiratory: Negative.   Cardiovascular: Negative.   Gastrointestinal: Negative.   Musculoskeletal: Negative.   Skin: Negative.   Neurological: Negative.   Hematological: Negative.   Psychiatric/Behavioral: Negative.    Past Medical History  Diagnosis Date  . Endometriosis 2000  . IBS (irritable bowel syndrome)     History   Social History  . Marital Status: Married    Spouse Name: N/A    Number of Children: N/A  . Years of Education: N/A   Occupational History  . Not on file.   Social History Main Topics  . Smoking status: Never Smoker   . Smokeless tobacco: Not on file  . Alcohol Use: 0.6 oz/week    1 Glasses of wine per week  . Drug Use: No  . Sexually Active: Not on file   Other Topics Concern  . Not on file   Social History Narrative  . No narrative on file    Past Surgical History  Procedure Date  . Hemorrhoid surgery   . Abdominal hysterectomy 2003  . Laparotomy 11/00  . Oophorectomy   . Stapedectomy 2003  . Orif elbow fracture 2011    performed in [place]    Family History  Problem Relation Age of Onset  . Atrial fibrillation Mother   . Hypertension Mother   . Arthritis Mother   . Osteoporosis Mother   . Cerebral aneurysm Father     hemorrhage  . Hypertension Brother   . Diabetes Brother     No Known Allergies  Current Outpatient Prescriptions on File  Prior to Visit  Medication Sig Dispense Refill  . Black Cohosh 40 MG CAPS Take 1 capsule by mouth daily.        . calcium-vitamin D 250-100 MG-UNIT per tablet Take 1 tablet by mouth 3 (three) times daily.        . cetirizine (ZYRTEC) 10 MG tablet Take 10 mg by mouth daily.        . magnesium oxide (MAG-OX) 400 MG tablet Take 400 mg by mouth daily.        . Multiple Vitamin (MULTIVITAMIN) tablet Take 1 tablet by mouth daily.        . fluticasone (FLONASE) 50 MCG/ACT nasal spray Place 2 sprays into the nose daily.  16 g  6    BP 112/70  Pulse 72  Temp 98.7 F (37.1 C) (Oral)  Wt 120 lb (54.432 kg)  SpO2 97%chart    Objective:   Physical Exam  Constitutional: She is oriented to person, place, and time. She appears well-developed and well-nourished.  HENT:  Nose: Nose normal.  Mouth/Throat: Oropharynx is clear and moist.       Right ear has a large amount of fluid.   Neck: Normal range of motion. Neck supple.  Cardiovascular: Normal rate, regular rhythm and normal heart sounds.   Pulmonary/Chest: Effort normal and breath sounds normal.  Neurological: She is alert and oriented to person, place, and time.  Skin: Skin is warm and dry.  Psychiatric: She has a normal mood and affect.          Assessment & Plan:  Assessment: Eustachian tube dysfunction, otalgia  Plan: Zyrtec once daily. Flonase 2 sprays in each nostril once a day. Patient call the office if symptoms worsen or persist. Recheck a schedule, when necessary.

## 2012-05-23 NOTE — Patient Instructions (Addendum)
Barotitis Media Barotitis media is soreness (inflammation) of the area behind the eardrum (middle ear). This occurs when the auditory tube (Eustachian tube) leading from the back of the throat to the eardrum is blocked. When it is blocked air cannot move in and out of the middle ear to equalize pressure changes. These pressure changes come from changes in altitude when:  Flying.   Driving in the mountains.   Diving.  Problems are more likely to occur with pressure changes during times when you are congested as from:  Hay fever.   Upper respiratory infection.   A cold.  Damage or hearing loss (barotrauma) caused by this may be permanent. HOME CARE INSTRUCTIONS   Use medicines as recommended by your caregiver. Over the counter medicines will help unblock the canal and can help during times of air travel.   Do not put anything into your ears to clean or unplug them. Eardrops will not be helpful.   Do not swim, dive, or fly until your caregiver says it is all right to do so. If these activities are necessary, chewing gum with frequent swallowing may help. It is also helpful to hold your nose and gently blow to pop your ears for equalizing pressure changes. This forces air into the Eustachian tube.   For little ones with problems, give your baby a bottle of water or juice during periods when pressure changes would be anticipated such as during take offs and landings associated with air travel.   Only take over-the-counter or prescription medicines for pain, discomfort, or fever as directed by your caregiver.   A decongestant may be helpful in de-congesting the middle ear and make pressure equalization easier. This can be even more effective if the drops (spray) are delivered with the head lying over the edge of a bed with the head tilted toward the ear on the affected side.   If your caregiver has given you a follow-up appointment, it is very important to keep that appointment. Not keeping  the appointment could result in a chronic or permanent injury, pain, hearing loss and disability. If there is any problem keeping the appointment, you must call back to this facility for assistance.  SEEK IMMEDIATE MEDICAL CARE IF:   You develop a severe headache, dizziness, severe ear pain, or bloody or pus-like drainage from your ears.   An oral temperature above 102 F (38.9 C) develops.   Your problems do not improve or become worse.  MAKE SURE YOU:   Understand these instructions.   Will watch your condition.   Will get help right away if you are not doing well or get worse.  Document Released: 08/12/2000 Document Revised: 08/04/2011 Document Reviewed: 03/20/2008 Exit[employer] Patient Information 2012 [employer]. 

## 2012-05-29 ENCOUNTER — Other Ambulatory Visit (HOSPITAL_COMMUNITY): Payer: Self-pay | Admitting: Obstetrics and Gynecology

## 2012-05-29 DIAGNOSIS — Z1231 Encounter for screening mammogram for malignant neoplasm of breast: Secondary | ICD-10-CM

## 2012-07-09 ENCOUNTER — Ambulatory Visit (HOSPITAL_COMMUNITY)
Admission: RE | Admit: 2012-07-09 | Discharge: 2012-07-09 | Disposition: A | Discharge status: Home / Self Care | Payer: BC Managed Care – PPO | Source: Ambulatory Visit | Attending: Obstetrics and Gynecology | Admitting: Obstetrics and Gynecology

## 2012-07-09 DIAGNOSIS — Z1231 Encounter for screening mammogram for malignant neoplasm of breast: Secondary | ICD-10-CM

## 2012-10-04 ENCOUNTER — Encounter: Payer: Self-pay | Admitting: Internal Medicine

## 2013-04-17 ENCOUNTER — Encounter: Payer: Self-pay | Admitting: Family Medicine

## 2013-04-17 ENCOUNTER — Ambulatory Visit (INDEPENDENT_AMBULATORY_CARE_PROVIDER_SITE_OTHER): Payer: BC Managed Care – PPO | Admitting: Family Medicine

## 2013-04-17 VITALS — BP 104/72 | HR 73 | Temp 98.1°F | Wt 117.0 lb

## 2013-04-17 DIAGNOSIS — R21 Rash and other nonspecific skin eruption: Secondary | ICD-10-CM

## 2013-04-17 MED ORDER — MUPIROCIN 2 % EX OINT: TOPICAL_OINTMENT | Freq: Three times a day (TID) | CUTANEOUS | Status: DC

## 2013-04-17 NOTE — Patient Instructions (Addendum)
Follow up for any fever or worsening rash.

## 2013-04-17 NOTE — Progress Notes (Signed)
  Subjective:    Patient ID: Monique Moore, female    DOB: 06/08/1956, 57 y.o.   MRN: [id number]  HPI Acute visit. Patient has perioral rash. Onset about 2-3 weeks ago. Slightly pruritic. She has area upper lip and lower lip. She has slight yellow crust early-morning. Denies any fever or chills. No change of makeup. No alleviating factors. She uses moisturizing creams. She has not noted any pustules or vesicles. No history of cold sores.  She used some Chapstick initially thinking this was dry skin but this has not helped  Past Medical History  Diagnosis Date  . Endometriosis 2000  . IBS (irritable bowel syndrome)    Past Surgical History  Procedure Laterality Date  . Hemorrhoid surgery    . Abdominal hysterectomy  2003  . Laparotomy  11/00  . Oophorectomy    . Stapedectomy  2003  . Orif elbow fracture  2011    performed in [place]    reports that she has never smoked. She does not have any smokeless tobacco history on file. She reports that she drinks about 0.6 ounces of alcohol per week. She reports that she does not use illicit drugs. family history includes Arthritis in her mother; Atrial fibrillation in her mother; Cerebral aneurysm in her father; Diabetes in her brother; Hypertension in her brother and mother; Osteoporosis in her mother. No Known Allergies    Review of Systems  Constitutional: Negative for fever and chills.  HENT: Negative for sore throat.   Skin: Positive for rash.  Hematological: Negative for adenopathy.       Objective:   Physical Exam  Constitutional: She appears well-developed and well-nourished.  Cardiovascular: Normal rate and regular rhythm.   Pulmonary/Chest: Effort normal and breath sounds normal. No respiratory distress. She has no wheezes. She has no rales.  Skin:  Patient has somewhat faint erythematous rash upper lip and lower lip. No ulceration. No induration. She has minimal yellow crusting involving the lower lip rash. No  evidence for any vesicles. No cold sores. No pustules          Assessment & Plan:  Facial rash. Question impetigo-type rash. Given the fact she has a little yellow crusting we'll cover with anti-staph topical Bactroban ointment twice daily. Touch base if not clearing over the next few days

## 2013-04-19 ENCOUNTER — Other Ambulatory Visit: Payer: Self-pay

## 2013-04-19 ENCOUNTER — Encounter: Payer: Self-pay | Admitting: Family Medicine

## 2013-04-19 MED ORDER — CEPHALEXIN 500 MG PO CAPS: 500.0000 mg | ORAL_CAPSULE | Freq: Three times a day (TID) | ORAL | Status: DC

## 2013-04-19 NOTE — Telephone Encounter (Signed)
Start Keflex 500 mg po TID for 7 days and let pt know

## 2013-06-10 ENCOUNTER — Other Ambulatory Visit (HOSPITAL_COMMUNITY): Payer: Self-pay | Admitting: Obstetrics and Gynecology

## 2013-06-10 DIAGNOSIS — Z1231 Encounter for screening mammogram for malignant neoplasm of breast: Secondary | ICD-10-CM

## 2013-06-11 ENCOUNTER — Other Ambulatory Visit (INDEPENDENT_AMBULATORY_CARE_PROVIDER_SITE_OTHER): Payer: BC Managed Care – PPO

## 2013-06-11 ENCOUNTER — Ambulatory Visit: Payer: BC Managed Care – PPO

## 2013-06-11 DIAGNOSIS — Z0000 Encounter for general adult medical examination without abnormal findings: Secondary | ICD-10-CM

## 2013-06-11 DIAGNOSIS — Z23 Encounter for immunization: Secondary | ICD-10-CM

## 2013-06-11 LAB — BASIC METABOLIC PANEL
Calcium: 9.6 mg/dL (ref 8.4–10.5)
GFR: 77.34 mL/min (ref >60.00)
Glucose, Bld: 114 mg/dL — ABNORMAL HIGH (ref 70–99)
Sodium: 139 mEq/L (ref 135–145)

## 2013-06-11 LAB — CBC WITH DIFFERENTIAL/PLATELET
Basophils Absolute: 0 10*3/uL (ref 0.0–0.1)
Hemoglobin: 14.3 g/dL (ref 12.0–15.0)
Lymphocytes Relative: 31.6 % (ref 12.0–46.0)
Monocytes Relative: 7.9 % (ref 3.0–12.0)
Neutro Abs: 4.4 10*3/uL (ref 1.4–7.7)
Neutrophils Relative %: 58 % (ref 43.0–77.0)
Platelets: 306 10*3/uL (ref 150.0–400.0)
RDW: 13.7 % (ref 11.5–14.6)

## 2013-06-11 LAB — LIPID PANEL
Cholesterol: 229 mg/dL — ABNORMAL HIGH (ref 0–200)
HDL: 109.1 mg/dL (ref >39.00)
Total CHOL/HDL Ratio: 2
Triglycerides: 114 mg/dL (ref 0.0–149.0)

## 2013-06-11 LAB — HEPATIC FUNCTION PANEL
AST: 26 U/L (ref 0–37)
Alkaline Phosphatase: 72 U/L (ref 39–117)
Bilirubin, Direct: 0.1 mg/dL (ref 0.0–0.3)
Total Bilirubin: 1.1 mg/dL (ref 0.3–1.2)

## 2013-06-11 LAB — POCT URINALYSIS DIPSTICK
Bilirubin, UA: NEGATIVE
Glucose, UA: NEGATIVE
Spec Grav, UA: 1.02

## 2013-07-03 ENCOUNTER — Encounter: Payer: Self-pay | Admitting: Internal Medicine

## 2013-07-03 ENCOUNTER — Ambulatory Visit (INDEPENDENT_AMBULATORY_CARE_PROVIDER_SITE_OTHER): Payer: BC Managed Care – PPO | Admitting: Internal Medicine

## 2013-07-03 VITALS — BP 120/76 | Temp 98.1°F | Ht 63.0 in | Wt 117.0 lb

## 2013-07-03 DIAGNOSIS — R197 Diarrhea, unspecified: Secondary | ICD-10-CM

## 2013-07-03 DIAGNOSIS — Z0000 Encounter for general adult medical examination without abnormal findings: Secondary | ICD-10-CM

## 2013-07-03 DIAGNOSIS — R7309 Other abnormal glucose: Secondary | ICD-10-CM

## 2013-07-03 DIAGNOSIS — R739 Hyperglycemia, unspecified: Secondary | ICD-10-CM

## 2013-07-03 LAB — HEMOGLOBIN A1C: Hgb A1c MFr Bld: 6 % (ref 4.6–6.5)

## 2013-07-03 NOTE — Progress Notes (Signed)
cpx   Past Medical History  Diagnosis Date  . Endometriosis 2000  . IBS (irritable bowel syndrome)     History   Social History  . Marital Status: Married    Spouse Name: N/A    Number of Children: N/A  . Years of Education: N/A   Occupational History  . Not on file.   Social History Main Topics  . Smoking status: Never Smoker   . Smokeless tobacco: Not on file  . Alcohol Use: 0.6 oz/week    1 Glasses of wine per week  . Drug Use: No  . Sexual Activity: Not on file   Other Topics Concern  . Not on file   Social History Narrative  . No narrative on file    Past Surgical History  Procedure Laterality Date  . Hemorrhoid surgery    . Abdominal hysterectomy  2003  . Laparotomy  11/00  . Oophorectomy    . Stapedectomy  2003  . Orif elbow fracture  2011    performed in [place]    Family History  Problem Relation Age of Onset  . Atrial fibrillation Mother   . Hypertension Mother   . Arthritis Mother   . Osteoporosis Mother   . Cerebral aneurysm Father     hemorrhage  . Hypertension Brother   . Diabetes Brother     No Known Allergies  Current Outpatient Prescriptions on File Prior to Visit  Medication Sig Dispense Refill  . calcium-vitamin D 250-100 MG-UNIT per tablet Take 1 tablet by mouth 3 (three) times daily.        . cephALEXin (KEFLEX) 500 MG capsule Take 1 capsule (500 mg total) by mouth 3 (three) times daily.  21 capsule  0  . cetirizine (ZYRTEC) 10 MG tablet Take 10 mg by mouth daily.        . fluticasone (FLONASE) 50 MCG/ACT nasal spray Place 2 sprays into the nose daily.  16 g  6  . magnesium oxide (MAG-OX) 400 MG tablet Take 400 mg by mouth daily.        . Multiple Vitamin (MULTIVITAMIN) tablet Take 1 tablet by mouth daily.        . mupirocin ointment (BACTROBAN) 2 % Apply topically 3 (three) times daily.  22 g  1   No current facility-administered medications on file prior to visit.     patient denies chest pain, shortness of breath,  orthopnea. Denies lower extremity edema, abdominal pain, change in appetite, change in bowel movements. Patient denies rashes, musculoskeletal complaints. No other specific complaints in a complete review of systems. Except occasional left elbow pain at olecranon  Reviewed vitals  Well-developed well-nourished female in no acute distress. HEENT exam atraumatic, normocephalic, extraocular muscles are intact. Neck is supple. No jugular venous distention no thyromegaly. Chest clear to auscultation without increased work of breathing. Cardiac exam S1 and S2 are regular. Abdominal exam active bowel sounds, soft, nontender. Extremities no edema. Neurologic exam she is alert without any motor sensory deficits. Gait is normal.  Well Visit- health maint UTD  Lab Results  Component Value Date   HGBA1C 6.0 07/03/2013

## 2013-07-03 NOTE — Progress Notes (Signed)
Pre-visit discussion using our clinic review tool. No additional management support is needed unless otherwise documented below in the visit note.  

## 2013-07-04 LAB — GLIA (IGA/G) + TTG IGA
Gliadin IgA: 4.7 U/mL (ref <20)
Gliadin IgG: 22.8 U/mL — ABNORMAL HIGH (ref <20)

## 2013-07-10 ENCOUNTER — Ambulatory Visit (HOSPITAL_COMMUNITY)
Admission: RE | Admit: 2013-07-10 | Discharge: 2013-07-10 | Disposition: A | Discharge status: Home / Self Care | Payer: BC Managed Care – PPO | Source: Ambulatory Visit | Attending: Obstetrics and Gynecology | Admitting: Obstetrics and Gynecology

## 2013-07-10 DIAGNOSIS — Z1231 Encounter for screening mammogram for malignant neoplasm of breast: Secondary | ICD-10-CM | POA: Insufficient documentation

## 2014-02-27 ENCOUNTER — Encounter: Payer: Self-pay | Admitting: Internal Medicine

## 2014-04-14 ENCOUNTER — Ambulatory Visit: Payer: BC Managed Care – PPO | Admitting: *Deleted

## 2014-06-04 ENCOUNTER — Ambulatory Visit (INDEPENDENT_AMBULATORY_CARE_PROVIDER_SITE_OTHER): Payer: BC Managed Care – PPO

## 2014-06-04 DIAGNOSIS — Z23 Encounter for immunization: Secondary | ICD-10-CM

## 2014-06-11 ENCOUNTER — Other Ambulatory Visit (HOSPITAL_COMMUNITY): Payer: Self-pay | Admitting: Obstetrics and Gynecology

## 2014-06-11 DIAGNOSIS — Z1231 Encounter for screening mammogram for malignant neoplasm of breast: Secondary | ICD-10-CM

## 2014-07-05 ENCOUNTER — Encounter: Payer: Self-pay | Admitting: Family Medicine

## 2014-07-10 ENCOUNTER — Ambulatory Visit (INDEPENDENT_AMBULATORY_CARE_PROVIDER_SITE_OTHER): Payer: BC Managed Care – PPO | Admitting: Family Medicine

## 2014-07-10 ENCOUNTER — Encounter: Payer: Self-pay | Admitting: Family Medicine

## 2014-07-10 VITALS — BP 90/60 | Temp 98.3°F | Wt 117.0 lb

## 2014-07-10 DIAGNOSIS — M79644 Pain in right finger(s): Secondary | ICD-10-CM

## 2014-07-10 MED ORDER — DOXYCYCLINE HYCLATE 100 MG PO TABS: 100.0000 mg | ORAL_TABLET | Freq: Two times a day (BID) | ORAL | Status: DC

## 2014-07-10 NOTE — Progress Notes (Signed)
  Tana ConchStephen Dragan Tamburrino, MD Phone: [phone]  Subjective:   Monique SalmonKatharine C Moore is a 58 y.o. year old very pleasant female patient who presents with the following:  Swollen/painful right thumb Right handed. States [place] and woke up with a swollen pulp space of her finger that was whitish in appearance. Since that time she has tried warm soaks, hydrogen peroxide, betadine. Area has remained swollen but not worsened. No injury or penetrating injury. Pain in all areas of pulp space but not severe described as mild ache. Finger appears slight red, swollen,whitish at times and also purple. Never experienced similar symptoms. No issues with her nailbed or finger nail. ROS- no fever chills nausea vomiting. Denies tick exposure.  Past Medical History- previously healthy other than IBS and endometriosis  Medications- reviewed and updated Current Outpatient Prescriptions  Medication Sig Dispense Refill  . BIOTIN PO Take by mouth.    . calcium-vitamin D 250-100 MG-UNIT per tablet Take 1 tablet by mouth 3 (three) times daily.      . cetirizine (ZYRTEC) 10 MG tablet Take 10 mg by mouth daily.      . magnesium oxide (MAG-OX) 400 MG tablet Take 400 mg by mouth daily.      . Multiple Vitamin (MULTIVITAMIN) tablet Take 1 tablet by mouth daily.      . vitamin B-12 (CYANOCOBALAMIN) 1000 MCG tablet Take 1,000 mcg by mouth daily.    Marland Kitchen. desonide (DESOWEN) 0.05 % ointment Apply 1 application topically daily.     Marland Kitchen. nystatin ointment (MYCOSTATIN) Apply 1 application topically daily.      No current facility-administered medications for this visit.    Objective: BP 90/60 mmHg  Temp(Src) 98.3 F (36.8 C)  Wt 117 lb (53.071 kg) Gen: NAD, resting comfortably in chair CV: RRR no murmurs rubs or gallops Lungs: CTAB no crackles, wheeze, rhonchi Ext: no edema in arm other than in right 1st finger Skin: warm, dry, right thumb skin taught compared to left. There is mild erythema as well  as purplish discoloration near bottom of thumb and whitish discoloration at top of finger (does not show as well on picture as in person)       Assessment/Plan:  Right thumb pain/swelling Potential pulp space Infection but diagnosis truly unclear. No clear source. No penetrating injury or abrasion to skin even.  Her nail and nailbed appear normal (I have seen similar thumb appearance in paronychia but not without it) Treat with doxycycline 100 mg twice a day 7 days (Deep abscess?). If no improvement, would consider MRI or sports medicine referral as they could ultrasound.   Meds ordered this encounter  Medications  . doxycycline (VIBRA-TABS) 100 MG tablet    Sig: Take 1 tablet (100 mg total) by mouth 2 (two) times daily.    Dispense:  20 tablet    Refill:  0

## 2014-07-10 NOTE — Patient Instructions (Signed)
Unclear diagnosis but does not appear to be nail related. Possible pulp space infection.   Trial doxycycline (covers MRSA, tick borne illness) x 10 days. See me back at end of course if does not improve or sooner if worsens. Consider MRI or ultrasound or sports med/ortho referral.

## 2014-07-11 ENCOUNTER — Ambulatory Visit (HOSPITAL_COMMUNITY)
Admission: RE | Admit: 2014-07-11 | Discharge: 2014-07-11 | Disposition: A | Discharge status: Home / Self Care | Payer: BC Managed Care – PPO | Source: Ambulatory Visit | Attending: Obstetrics and Gynecology | Admitting: Obstetrics and Gynecology

## 2014-07-11 DIAGNOSIS — Z1231 Encounter for screening mammogram for malignant neoplasm of breast: Secondary | ICD-10-CM | POA: Diagnosis present

## 2014-07-12 ENCOUNTER — Encounter: Payer: Self-pay | Admitting: Family Medicine

## 2014-07-12 ENCOUNTER — Ambulatory Visit (INDEPENDENT_AMBULATORY_CARE_PROVIDER_SITE_OTHER): Payer: BC Managed Care – PPO | Admitting: Family Medicine

## 2014-07-12 VITALS — BP 100/64 | Temp 98.3°F | Wt 119.0 lb

## 2014-07-12 DIAGNOSIS — M7989 Other specified soft tissue disorders: Secondary | ICD-10-CM | POA: Insufficient documentation

## 2014-07-12 NOTE — Assessment & Plan Note (Signed)
Still with unclear source.  This doesn't look typical at all for infection, but I would continue the doxy.  PCP's notes reviewed.  I agree with PCP about imaging.  I'll ask for PCP to consider MRI vs u/s with sports medicine, whichever can be set up first.  Patient to monitor the digit over the weekend.  W/o any signs of spreading infection, I wouldn't intervene o/w today.  She agrees.

## 2014-07-12 NOTE — Patient Instructions (Signed)
I'll check with Dr. Durene CalHunter in the meantime.  Continue the doxy for now and the clinic should call you Monday.

## 2014-07-12 NOTE — Progress Notes (Signed)
R thumb sx started about 2 weeks ago.  No trauma, no trigger known. Concern for possible infection, started on doxy 2 days ago.  No FCNAVD.  The finger isn't really sore but the sensation feels different compared to the other thumb/digits.    The distal R thumb discoloration is a little worse today.  It wasn't discolored initially; the skin color changes started about 7 days ago.    It never drained, no FB known.    She doesn't feel poorly and has no other complaints.   Meds, vitals, and allergies reviewed.   ROS: See HPI.  Otherwise, noncontributory.  nad R arm and hand with normal inspection except for chronic changes at the R elbow and some darkening of the skin on the pad of the R thumb.  Normal ROM of the thumb, normal cap refill at the nail beds on the R hand.  No IP or MCP synovitis.  The thumb is distally puffy but not tight or tender. No fluctuant mass.

## 2014-07-14 ENCOUNTER — Encounter: Payer: Self-pay | Admitting: Family Medicine

## 2014-07-14 ENCOUNTER — Telehealth: Payer: Self-pay | Admitting: *Deleted

## 2014-07-14 ENCOUNTER — Telehealth: Payer: Self-pay | Admitting: Internal Medicine

## 2014-07-14 DIAGNOSIS — M7989 Other specified soft tissue disorders: Secondary | ICD-10-CM

## 2014-07-14 NOTE — Telephone Encounter (Signed)
I reviewed note. Dr. Katrinka BlazingSmith out of office today to discuss ultrasound. I also think MRI likely more informative so will proceed forward. Have ordered. Ardine BjorkKeba can you please check with Gavin PoundDeborah about getting this scheduled as soon as possible (tomorrow if we can) and then call patient with appointment. Thanks

## 2014-07-14 NOTE — Telephone Encounter (Signed)
Ms. Lupita LeashDonna can you please get pt scheduled for tomorrow if possible per Dr. Durene CalHunter

## 2014-07-14 NOTE — Telephone Encounter (Signed)
Dr. Hunter please advise. 

## 2014-07-14 NOTE — Telephone Encounter (Signed)
Pt scheduled for 07/16/14

## 2014-07-14 NOTE — Telephone Encounter (Signed)
[facility] Triage Call Report Triage Record Num: [id number] Operator: Al CorpusKristen Binkney Patient Name: Marcella DubsKatharine Krahl Call Date & Time: 07/12/2014 9:30:47AM Patient Phone: [phone] PCP: Tana ConchStephen Hunter Patient Gender: Female PCP Fax : [phone] Patient DOB: 1956-03-07 Practice Name: [facility] Reason for Call: Caller: Maurisha/Patient; PCP: Tana ConchHunter, Stephen; CB#: [phone]; Call regarding Thumb is swollen and tip is dark blue/purple - taking doxycycline - numbness in thumb. Pt was seen in office on 11/12 for these sxs and started on abx for unknown diagnosis but possible pulp space infection and was told to call back right away for any new or worsening sxs. States upon waking today, 11/14, the discoloration is darker and there is increased swelling and now some numbness as well. Per instructions to f/u, appt made for today at 12:45pm. Agreed to plan. Protocol(s) Used: Office Note Recommended Outcome per Protocol: Information Noted and Sent to Office Reason for Outcome: Caller information to office Care Advice: ~ 11/

## 2014-07-14 NOTE — Telephone Encounter (Signed)
Order has to be changed to MR HAND RIGHT with /without CONTRAST per [facility] [phone]

## 2014-07-14 NOTE — Telephone Encounter (Signed)
[facility]. She was told by the Dr that he would send Dr Durene CalHunter information on the pt say that he would set her up for a US or Mri. Pt said she was told so one would contact her by lunch time.

## 2014-07-16 ENCOUNTER — Encounter: Payer: Self-pay | Admitting: Family Medicine

## 2014-07-16 ENCOUNTER — Other Ambulatory Visit: Payer: Self-pay | Admitting: Family Medicine

## 2014-07-16 ENCOUNTER — Ambulatory Visit
Admission: RE | Admit: 2014-07-16 | Discharge: 2014-07-16 | Disposition: A | Discharge status: Home / Self Care | Payer: BC Managed Care – PPO | Source: Ambulatory Visit | Attending: Family Medicine | Admitting: Family Medicine

## 2014-07-16 DIAGNOSIS — M7989 Other specified soft tissue disorders: Secondary | ICD-10-CM

## 2014-07-16 DIAGNOSIS — Z5309 Procedure and treatment not carried out because of other contraindication: Secondary | ICD-10-CM | POA: Insufficient documentation

## 2014-07-16 MED ORDER — GADOBENATE DIMEGLUMINE 529 MG/ML IV SOLN
10.0000 mL | Freq: Once | INTRAVENOUS | Status: AC | PRN
  Administered 2014-07-16: 10 mL via INTRAVENOUS

## 2014-07-17 ENCOUNTER — Telehealth: Payer: Self-pay | Admitting: Family Medicine

## 2014-07-17 DIAGNOSIS — M79644 Pain in right finger(s): Secondary | ICD-10-CM

## 2014-07-17 NOTE — Telephone Encounter (Signed)
Called patient to discuss MRI. Plan for x-ray and f/u based on that-consider ID (less likely) vs. Hand center for their opinion

## 2014-07-18 ENCOUNTER — Encounter: Payer: Self-pay | Admitting: Family Medicine

## 2014-07-18 ENCOUNTER — Other Ambulatory Visit: Payer: Self-pay

## 2014-07-18 ENCOUNTER — Ambulatory Visit (INDEPENDENT_AMBULATORY_CARE_PROVIDER_SITE_OTHER)
Admission: RE | Admit: 2014-07-18 | Discharge: 2014-07-18 | Disposition: A | Discharge status: Home / Self Care | Payer: BC Managed Care – PPO | Source: Ambulatory Visit | Attending: Family Medicine | Admitting: Family Medicine

## 2014-07-18 DIAGNOSIS — M79644 Pain in right finger(s): Secondary | ICD-10-CM

## 2014-07-18 MED ORDER — DOXYCYCLINE HYCLATE 100 MG PO TABS: 100.0000 mg | ORAL_TABLET | Freq: Two times a day (BID) | ORAL | Status: DC

## 2014-07-29 ENCOUNTER — Telehealth: Payer: Self-pay | Admitting: Family Medicine

## 2014-07-29 MED ORDER — DOXYCYCLINE HYCLATE 100 MG PO TABS: 100.0000 mg | ORAL_TABLET | Freq: Two times a day (BID) | ORAL | Status: DC

## 2014-07-29 NOTE — Telephone Encounter (Signed)
Dr. Durene CalHunter please advise. I fwd her mychart message to you as well.

## 2014-07-29 NOTE — Telephone Encounter (Signed)
mychart encounter response

## 2014-07-29 NOTE — Telephone Encounter (Signed)
I will ask Ardine BjorkKeba to figure out how to get you the MRI and x-ray ( i assume needs to be on disk). Keba-please check with Claris CheMargaret about how we can get this information for patient (or how we can send it to the hand center).

## 2014-07-29 NOTE — Telephone Encounter (Signed)
Pt called to say that her thumb is swollen again and is asking if she need to take another round of antibiotics. Pt said she is leaving at 6am in the morning .

## 2014-08-12 ENCOUNTER — Encounter: Payer: Self-pay | Admitting: Family Medicine

## 2014-08-12 ENCOUNTER — Ambulatory Visit (INDEPENDENT_AMBULATORY_CARE_PROVIDER_SITE_OTHER): Payer: BC Managed Care – PPO | Admitting: Family Medicine

## 2014-08-12 DIAGNOSIS — E785 Hyperlipidemia, unspecified: Secondary | ICD-10-CM

## 2014-08-12 DIAGNOSIS — J309 Allergic rhinitis, unspecified: Secondary | ICD-10-CM | POA: Insufficient documentation

## 2014-08-12 DIAGNOSIS — M810 Age-related osteoporosis without current pathological fracture: Secondary | ICD-10-CM | POA: Insufficient documentation

## 2014-08-12 DIAGNOSIS — N182 Chronic kidney disease, stage 2 (mild): Secondary | ICD-10-CM

## 2014-08-12 DIAGNOSIS — R739 Hyperglycemia, unspecified: Secondary | ICD-10-CM | POA: Insufficient documentation

## 2014-08-12 DIAGNOSIS — M7989 Other specified soft tissue disorders: Secondary | ICD-10-CM

## 2014-08-12 NOTE — Assessment & Plan Note (Addendum)
Updated patient of diagnosis. Plan repeat BMET at least yearly for now and if GFR slips below 60 (Currently 70-80) consider ace-i. Discussed avoiding nsaids.

## 2014-08-12 NOTE — Patient Instructions (Addendum)
Come back for fasting labs at your convenience.   Otherwise, things look great.   Keep up your exercise and healthy eating-your diabetes puts you at risk   Cryotherapy performed for below-this may blister up. Vaseline and bandaid reasonable. May have slight redness after heals or skin color change but want the scaling portion to be gone.   Actinic Keratosis Actinic keratosis is a precancerous growth on the skin. This means it could develop into skin cancer if it is not treated. About 1% of actinic keratoses turn into skin cancer within a year. It is important to have all such growths removed to prevent them from developing into skin cancer. CAUSES  Actinic keratosis is caused by getting too much ultraviolet (UV) radiation from the sun or other UV light sources. RISK FACTORS Factors that increase your chances of getting actinic keratosis include:  Having light-colored skin and blue eyes.  Having blonde or red hair.  Spending a lot of time in the sun.  Age. The risk of actinic keratosis increases with age. SYMPTOMS  Actinic keratosis growths look like scaly, rough spots of skin. They can be as small as a pinhead or as big as a quarter. They may itch, hurt, or feel sensitive. Sometimes there is a little tag of pink or gray skin growing off them. In some cases, actinic keratoses are easier felt than seen. They do not go away with the use of moisturizing lotions or creams. Actinic keratoses appear most often on areas of skin that get a lot of sun exposure. These areas include the:  Scalp.  Face.  Ears.  Lips.  Upper back.  Backs of the hands.  Forearms. DIAGNOSIS  Your health care provider can usually tell what is wrong by performing a physical exam. A tissue sample (biopsy) may also be taken and examined under a microscope. TREATMENT  Actinic keratosis can be treated several ways. Most treatments can be done in your health care provider's office. Treatment options may  include:  Curettage. A tool is used to gently scrape off the growth.  Cryosurgery. Liquid nitrogen is applied to the growth to freeze it. The growth eventually falls off the skin.  Medicated creams, such as 5-fluorouracil or imiquimod. The medicine destroys the cells in the growth.  Chemical peels. Chemicals are applied to the growth and the outer layers of skin are peeled off.  Photodynamic therapy. A drug that makes your skin more sensitive to light is applied to the skin. A strong, blue light is aimed at the skin and destroys the growth. PREVENTION  To prevent future sun damage:  Try to avoid the sun between 10:00 a.m. and 4:00 p.m. when it is the strongest.  Use a sunscreen or sunblock with SPF 30 or greater.  Apply sunscreen at least 30 minutes before exposure to the sun.  Always wear protective hats, clothing, and sunglasses with UV protection.  Avoid medicines, herbs, and foods that increase your sensitivity to sunlight.  Avoid tanning beds. HOME CARE INSTRUCTIONS   If your skin was covered with a bandage, change and remove the bandage as directed by your health care provider.  Keep the treated area dry as directed by your health care provider.  Apply any creams as prescribed by your health care provider. Follow the directions carefully.  Check your skin regularly for any changes.  Visit a skin doctor (dermatologist) every year for a skin exam. SEEK MEDICAL CARE IF:   Your skin does not heal and becomes irritated, red,  or bleeds.  You notice any changes or new growths on your skin. Document Released: 11/11/2008 Document Revised: 12/30/2013 Document Reviewed: 09/26/2011 [facility] Patient Information 2015 [place], [place]. This information is not intended to replace advice given to you by your health care provider. Make sure you discuss any questions you have with your health care provider.

## 2014-08-12 NOTE — Progress Notes (Signed)
Tana ConchStephen Kesleigh Morson, MD Phone: [phone]  Subjective:  Patient presents today to establish care with me as their new primary care provider. Patient was formerly a patient of Dr. Cato MulliganSwords. Chief complaint-noted.   Updated patient on her CKD stage II diagnosis. Also updated her on hyperglycemia with a1c of 6. She was not aware of her increased risk other than morbidly obese brother had diabetes. Her swollen thumb/infection was evaluated by the [facility] [place] and treated with Septra. Offered debridement but patient opted not to do this. She is about halfway through septra and symptoms have pretty much resolved-normal color in right thumb and no current pain. Mild hyperlipidemia noted but VERY protective HDL also discussed.   ROS- no urinary issues including decreased urination, dysuria. No polyuria or unintentional weight loss. No fever/chills/nausea/vomiting.   The following were reviewed and entered/updated in epic: Past Medical History  Diagnosis Date  . Endometriosis 2000    endometriosis related, pain with intercourse  . IBS (irritable bowel syndrome)     ? improved with diet changes   Patient Active Problem List   Diagnosis Date Noted  . Hyperglycemia 08/12/2014    Priority: Medium  . CKD (chronic kidney disease), stage II 08/12/2014    Priority: Medium  . Thumb swelling 07/12/2014    Priority: Medium  . Osteopenia 08/12/2014    Priority: Low  . Allergic rhinitis 08/12/2014    Priority: Low  . MRI >1.5 T contraindicated due to metal implant 07/16/2014    Priority: Low   Past Surgical History  Procedure Laterality Date  . Hemorrhoid surgery      growth on hemorrhoid-benign  . Abdominal hysterectomy  2003    benign, cervix included  . Laparotomy  11/00    eval endometriosis  . Oophorectomy    . Stapedectomy  2003    otosclerosis L  . Orif elbow fracture  2011    performed in [place], hiking  . Foot surgery      Dr. Lestine BoxBednarz [facility] [facility]    Family History    Problem Relation Age of Onset  . Atrial fibrillation Mother   . Hypertension Mother   . Arthritis Mother   . Osteoporosis Mother   . Cerebral aneurysm Father     hemorrhage  . Diabetes Father   . Hypertension Brother   . Diabetes Brother     Medications- reviewed and updated Current Outpatient Prescriptions  Medication Sig Dispense Refill  . BIOTIN PO Take by mouth.    . calcium-vitamin D 250-100 MG-UNIT per tablet Take 1 tablet by mouth 3 (three) times daily.      . cetirizine (ZYRTEC) 10 MG tablet Take 10 mg by mouth daily.      . magnesium oxide (MAG-OX) 400 MG tablet Take 400 mg by mouth daily.      . Multiple Vitamin (MULTIVITAMIN) tablet Take 1 tablet by mouth daily.      . vitamin B-12 (CYANOCOBALAMIN) 1000 MCG tablet Take 1,000 mcg by mouth daily.     No current facility-administered medications for this visit.    Allergies-reviewed and updated No Known Allergies  History   Social History  . Marital Status: Married    Spouse Name: N/A    Number of Children: N/A  . Years of Education: N/A   Social History Main Topics  . Smoking status: Never Smoker   . Smokeless tobacco: None  . Alcohol Use: 4.2 - 6.0 oz/week    7-10 Glasses of wine per week  .  Drug Use: No  . Sexual Activity: None   Other Topics Concern  . None   Social History Narrative   Married 1979. 2 children (son '85 and daughter '88). 1 grandson '15.       Retired. 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       Hobbies: paint, water ski, travel    ROS--See HPI   Objective: BP 110/74 mmHg  Temp(Src) 98.5 F (36.9 C)  Wt 115 lb (52.164 kg) Gen: NAD, resting comfortably HEENT: Mucous membranes are moist. Oropharynx normal. TM normal. Good dentition. CV: RRR no murmurs rubs or gallops Lungs: CTAB no crackles, wheeze, rhonchi Abdomen: soft/nontender/nondistended/normal bowel sounds.  Ext: no edema, 2+ PT pulses Skin: warm, dry, no rash, single actinic keratosis 2 x 2  mm left shin  Neuro: grossly normal, moves all extremities, PERRLA   Assessment/Plan:  CKD (chronic kidney disease), stage II Updated patient of diagnosis. Plan repeat BMET at least yearly for now and if GFR slips below 60 (Currently 70-80) consider ace-i.   Hyperglycemia Repeat a1c at this time. Warned of increased diabetes risk and encouraged patient to consider her 6x a week exercise as well as healthy eating-BMI not a concern.   Thumb swelling Responding to septra through the hand center. Continue to follow clinically and treat as needed with antibiotics. Follow up planned with the hand center.    >50% of 25 minute office visit was spent on counseling (comforting patient about dealing with this thumb infection, explaining and comforting over ckd stage II diagnosis, counseling on what is needed to try to prevent DM but recognizing genetic component) and coordination of care  Return to update fasting labs Orders Placed This Encounter  Procedures  . CBC    [place]    Standing Status: Future     Number of Occurrences:      Standing Expiration Date: 08/13/2015  . Comprehensive metabolic panel    [place]    Standing Status: Future     Number of Occurrences:      Standing Expiration Date: 08/13/2015    Order Specific Question:  Has the patient fasted?    Answer:  No  . Lipid panel    [place]    Standing Status: Future     Number of Occurrences:      Standing Expiration Date: 08/13/2015    Order Specific Question:  Has the patient fasted?    Answer:  No  . Hemoglobin A1c        Standing Status: Future     Number of Occurrences:      Standing Expiration Date: 08/13/2015  . TSH        Standing Status: Future     Number of Occurrences:      Standing Expiration Date: 08/13/2015  check lipids for elevated total cholesterol, but does not likely need medication due to high HDL which is honestly probably cause of high total.

## 2014-08-12 NOTE — Assessment & Plan Note (Signed)
Repeat a1c at this time. Warned of increased diabetes risk and encouraged patient to consider her 6x a week exercise as well as healthy eating-BMI not a concern.

## 2014-08-12 NOTE — Assessment & Plan Note (Signed)
Responding to septra through the hand center. Continue to follow clinically and treat as needed with antibiotics. Follow up planned with the hand center.

## 2014-08-19 ENCOUNTER — Other Ambulatory Visit (INDEPENDENT_AMBULATORY_CARE_PROVIDER_SITE_OTHER): Payer: BC Managed Care – PPO

## 2014-08-19 DIAGNOSIS — E785 Hyperlipidemia, unspecified: Secondary | ICD-10-CM

## 2014-08-19 DIAGNOSIS — R739 Hyperglycemia, unspecified: Secondary | ICD-10-CM

## 2014-08-19 LAB — COMPREHENSIVE METABOLIC PANEL
ALT: 24 U/L (ref 0–35)
AST: 26 U/L (ref 0–37)
Albumin: 4.3 g/dL (ref 3.5–5.2)
Alkaline Phosphatase: 70 U/L (ref 39–117)
BUN: 15 mg/dL (ref 6–23)
CO2: 26 meq/L (ref 19–32)
CREATININE: 0.8 mg/dL (ref 0.4–1.2)
Calcium: 9.1 mg/dL (ref 8.4–10.5)
Chloride: 99 mEq/L (ref 96–112)
GFR: 84.18 mL/min (ref >60.00)
Glucose, Bld: 99 mg/dL (ref 70–99)
Potassium: 4.3 mEq/L (ref 3.5–5.1)
Sodium: 134 mEq/L — ABNORMAL LOW (ref 135–145)
Total Bilirubin: 0.8 mg/dL (ref 0.2–1.2)
Total Protein: 6.7 g/dL (ref 6.0–8.3)

## 2014-08-19 LAB — LIPID PANEL
Cholesterol: 224 mg/dL — ABNORMAL HIGH (ref 0–200)
HDL: 92.4 mg/dL (ref >39.00)
LDL Cholesterol: 117 mg/dL — ABNORMAL HIGH (ref 0–99)
NonHDL: 131.6
TRIGLYCERIDES: 71 mg/dL (ref 0.0–149.0)
Total CHOL/HDL Ratio: 2
VLDL: 14.2 mg/dL (ref 0.0–40.0)

## 2014-08-19 LAB — CBC
HCT: 40.7 % (ref 36.0–46.0)
Hemoglobin: 13.3 g/dL (ref 12.0–15.0)
MCHC: 32.7 g/dL (ref 30.0–36.0)
MCV: 90.7 fl (ref 78.0–100.0)
PLATELETS: 289 10*3/uL (ref 150.0–400.0)
RBC: 4.49 Mil/uL (ref 3.87–5.11)
RDW: 13.8 % (ref 11.5–15.5)
WBC: 5.3 10*3/uL (ref 4.0–10.5)

## 2014-08-19 LAB — TSH: TSH: 1.6 u[IU]/mL (ref 0.35–4.50)

## 2014-08-19 LAB — HEMOGLOBIN A1C: Hgb A1c MFr Bld: 6.2 % (ref 4.6–6.5)

## 2014-09-10 ENCOUNTER — Ambulatory Visit: Payer: BC Managed Care – PPO | Admitting: Family Medicine

## 2014-09-22 ENCOUNTER — Encounter: Payer: Self-pay | Admitting: Family Medicine

## 2014-09-22 ENCOUNTER — Ambulatory Visit (INDEPENDENT_AMBULATORY_CARE_PROVIDER_SITE_OTHER): Payer: BLUE CROSS/BLUE SHIELD | Admitting: Family Medicine

## 2014-09-22 VITALS — BP 144/73 | HR 92 | Temp 98.4°F | Ht 63.0 in | Wt 117.0 lb

## 2014-09-22 DIAGNOSIS — R8290 Unspecified abnormal findings in urine: Secondary | ICD-10-CM

## 2014-09-22 DIAGNOSIS — N390 Urinary tract infection, site not specified: Secondary | ICD-10-CM

## 2014-09-22 DIAGNOSIS — R8299 Other abnormal findings in urine: Secondary | ICD-10-CM

## 2014-09-22 MED ORDER — CIPROFLOXACIN HCL 500 MG PO TABS: 500.0000 mg | ORAL_TABLET | Freq: Two times a day (BID) | ORAL | Status: DC

## 2014-09-22 NOTE — Progress Notes (Signed)
   Subjective:    Patient ID: Monique Moore, female    DOB: 13-Jun-1956, [age] y.o.   MRN: [id number]  HPI Here for 5 days of cloudy urine, burning, and urgency to urinate. She is drinking lots of water and she has taken 3 days of Doxycycline she had at home. Today she feels better.    Review of Systems  Constitutional: Negative.   Genitourinary: Positive for dysuria, urgency and frequency. Negative for hematuria, flank pain and pelvic pain.       Objective:   Physical Exam  Constitutional: She appears well-developed and well-nourished.  Abdominal: Soft. Bowel sounds are normal. She exhibits no distension and no mass. There is no tenderness. There is no rebound and no guarding.          Assessment & Plan:  She was unable to produce a urine sample today. Treat with 7 days of Cipro. Recheck prn

## 2014-09-22 NOTE — Progress Notes (Signed)
Pre visit review using our clinic review tool, if applicable. No additional management support is needed unless otherwise documented below in the visit note. 

## 2014-09-22 NOTE — Addendum Note (Signed)
Addended by: Aniceto BossNIMMONS, Mayreli Alden A on: 09/22/2014 10:39 AM   Modules accepted: Orders

## 2014-12-17 ENCOUNTER — Other Ambulatory Visit: Payer: Self-pay | Admitting: Obstetrics and Gynecology

## 2014-12-19 LAB — CYTOLOGY - PAP

## 2015-06-12 ENCOUNTER — Ambulatory Visit (INDEPENDENT_AMBULATORY_CARE_PROVIDER_SITE_OTHER): Payer: BLUE CROSS/BLUE SHIELD

## 2015-06-12 DIAGNOSIS — Z23 Encounter for immunization: Secondary | ICD-10-CM

## 2015-08-05 ENCOUNTER — Other Ambulatory Visit: Payer: Self-pay

## 2015-08-05 DIAGNOSIS — Z1231 Encounter for screening mammogram for malignant neoplasm of breast: Secondary | ICD-10-CM

## 2015-09-10 ENCOUNTER — Encounter: Payer: Self-pay | Admitting: Family Medicine

## 2015-09-10 ENCOUNTER — Other Ambulatory Visit (INDEPENDENT_AMBULATORY_CARE_PROVIDER_SITE_OTHER): Payer: BLUE CROSS/BLUE SHIELD

## 2015-09-10 ENCOUNTER — Telehealth: Payer: Self-pay | Admitting: Family Medicine

## 2015-09-10 DIAGNOSIS — Z0000 Encounter for general adult medical examination without abnormal findings: Secondary | ICD-10-CM | POA: Diagnosis not present

## 2015-09-10 LAB — HEPATIC FUNCTION PANEL
ALT: 14 U/L (ref 0–35)
AST: 21 U/L (ref 0–37)
Albumin: 4.2 g/dL (ref 3.5–5.2)
Alkaline Phosphatase: 92 U/L (ref 39–117)
BILIRUBIN DIRECT: 0.1 mg/dL (ref 0.0–0.3)
BILIRUBIN TOTAL: 0.4 mg/dL (ref 0.2–1.2)
TOTAL PROTEIN: 6.4 g/dL (ref 6.0–8.3)

## 2015-09-10 LAB — CBC WITH DIFFERENTIAL/PLATELET
BASOS PCT: 0.7 % (ref 0.0–3.0)
Basophils Absolute: 0 10*3/uL (ref 0.0–0.1)
EOS ABS: 0.3 10*3/uL (ref 0.0–0.7)
EOS PCT: 4.6 % (ref 0.0–5.0)
HEMATOCRIT: 40 % (ref 36.0–46.0)
HEMOGLOBIN: 13.2 g/dL (ref 12.0–15.0)
LYMPHS PCT: 38 % (ref 12.0–46.0)
Lymphs Abs: 2.7 10*3/uL (ref 0.7–4.0)
MCHC: 33 g/dL (ref 30.0–36.0)
MCV: 89.7 fl (ref 78.0–100.0)
MONO ABS: 0.7 10*3/uL (ref 0.1–1.0)
Monocytes Relative: 9.7 % (ref 3.0–12.0)
Neutro Abs: 3.4 10*3/uL (ref 1.4–7.7)
Neutrophils Relative %: 47 % (ref 43.0–77.0)
Platelets: 345 10*3/uL (ref 150.0–400.0)
RBC: 4.46 Mil/uL (ref 3.87–5.11)
RDW: 13.2 % (ref 11.5–15.5)
WBC: 7.2 10*3/uL (ref 4.0–10.5)

## 2015-09-10 LAB — POCT URINALYSIS DIPSTICK
BILIRUBIN UA: NEGATIVE
GLUCOSE UA: NEGATIVE
KETONES UA: NEGATIVE
Nitrite, UA: POSITIVE
Protein, UA: NEGATIVE
SPEC GRAV UA: 1.01
Urobilinogen, UA: 0.2
pH, UA: 5.5

## 2015-09-10 LAB — LIPID PANEL
CHOL/HDL RATIO: 2
Cholesterol: 172 mg/dL (ref 0–200)
HDL: 84.8 mg/dL (ref >39.00)
LDL CALC: 74 mg/dL (ref 0–99)
NONHDL: 87.18
Triglycerides: 67 mg/dL (ref 0.0–149.0)
VLDL: 13.4 mg/dL (ref 0.0–40.0)

## 2015-09-10 LAB — BASIC METABOLIC PANEL
BUN: 11 mg/dL (ref 6–23)
CHLORIDE: 98 meq/L (ref 96–112)
CO2: 27 mEq/L (ref 19–32)
CREATININE: 0.76 mg/dL (ref 0.40–1.20)
Calcium: 9.2 mg/dL (ref 8.4–10.5)
GFR: 82.6 mL/min (ref >60.00)
Glucose, Bld: 101 mg/dL — ABNORMAL HIGH (ref 70–99)
POTASSIUM: 4.8 meq/L (ref 3.5–5.1)
Sodium: 132 mEq/L — ABNORMAL LOW (ref 135–145)

## 2015-09-10 LAB — TSH: TSH: 2.54 u[IU]/mL (ref 0.35–4.50)

## 2015-09-10 MED ORDER — CEPHALEXIN 500 MG PO CAPS: 500.0000 mg | ORAL_CAPSULE | Freq: Three times a day (TID) | ORAL | Status: DC

## 2015-09-10 NOTE — Telephone Encounter (Signed)
Pt call to say you were going to call her about her lab results

## 2015-09-10 NOTE — Telephone Encounter (Signed)
Pt replied to Dr. Durene CalHunter via Earleen Reapermychart.

## 2015-09-10 NOTE — Telephone Encounter (Signed)
Called pt and was unable to reach. I sent a mychart message asking if she has any current urinary symptoms.

## 2015-09-13 LAB — URINE CULTURE: Colony Count: >=100000

## 2015-09-14 ENCOUNTER — Encounter: Payer: Self-pay | Admitting: Family Medicine

## 2015-09-14 ENCOUNTER — Ambulatory Visit
Admission: RE | Admit: 2015-09-14 | Discharge: 2015-09-14 | Disposition: A | Discharge status: Home / Self Care | Payer: BLUE CROSS/BLUE SHIELD | Source: Ambulatory Visit

## 2015-09-14 ENCOUNTER — Ambulatory Visit (INDEPENDENT_AMBULATORY_CARE_PROVIDER_SITE_OTHER): Payer: BLUE CROSS/BLUE SHIELD | Admitting: Family Medicine

## 2015-09-14 VITALS — BP 120/64 | HR 71 | Temp 98.0°F | Ht 61.5 in | Wt 116.0 lb

## 2015-09-14 DIAGNOSIS — Z0000 Encounter for general adult medical examination without abnormal findings: Secondary | ICD-10-CM

## 2015-09-14 DIAGNOSIS — Z1231 Encounter for screening mammogram for malignant neoplasm of breast: Secondary | ICD-10-CM

## 2015-09-14 DIAGNOSIS — R739 Hyperglycemia, unspecified: Secondary | ICD-10-CM

## 2015-09-14 DIAGNOSIS — E871 Hypo-osmolality and hyponatremia: Secondary | ICD-10-CM

## 2015-09-14 DIAGNOSIS — Z20828 Contact with and (suspected) exposure to other viral communicable diseases: Secondary | ICD-10-CM

## 2015-09-14 NOTE — Patient Instructions (Addendum)
Schedule lab visit for around  6 Months at check out desk. Perhaps in afternoon nonfasting. We will check hepatitis C, a1c, as well as repeat sodium.   Honestly, I think you are doing really well. I would see if you can work with Dr. Gerilyn PilgrimSykes to get as much calcium as possible through diet with your osteopenia. Would continue vitamin D

## 2015-09-14 NOTE — Progress Notes (Signed)
Monique Conch, MD Phone: [phone]  Subjective:  Patient presents today for their annual physical. Chief complaint-noted.   See problem oriented charting- ROS- full  review of systems was completed and negative except for: urgency urinary that is improving.   The following were reviewed and entered/updated in epic: Past Medical History  Diagnosis Date  . Endometriosis 2000    endometriosis related, pain with intercourse  . IBS (irritable bowel syndrome)     ? improved with diet changes   Patient Active Problem List   Diagnosis Date Noted  . Hyperglycemia 08/12/2014    Priority: Medium  . Thumb swelling 07/12/2014    Priority: Medium  . Osteopenia 08/12/2014    Priority: Low  . Allergic rhinitis 08/12/2014    Priority: Low  . MRI >1.5 T contraindicated due to metal implant 07/16/2014    Priority: Low   Past Surgical History  Procedure Laterality Date  . Hemorrhoid surgery      growth on hemorrhoid-benign  . Abdominal hysterectomy  2003    benign, cervix included  . Laparotomy  11/00    eval endometriosis  . Oophorectomy    . Stapedectomy  2003    otosclerosis L  . Orif elbow fracture  2011    performed in [place], hiking  . Foot surgery      Dr. Lestine Box [facility] [facility]    Family History  Problem Relation Age of Onset  . Atrial fibrillation Mother   . Hypertension Mother   . Arthritis Mother   . Osteoporosis Mother   . Cerebral aneurysm Father     hemorrhage  . Diabetes Father   . Hypertension Brother   . Diabetes Brother     Medications- reviewed and updated Current Outpatient Prescriptions  Medication Sig Dispense Refill  . BIOTIN PO Take by mouth.    . calcium-vitamin D 250-100 MG-UNIT per tablet Take 1 tablet by mouth 3 (three) times daily.      . cephALEXin (KEFLEX) 500 MG capsule Take 1 capsule (500 mg total) by mouth 3 (three) times daily. 21 capsule 0  . cetirizine (ZYRTEC) 10 MG tablet Take 10 mg by mouth daily.      . magnesium oxide (MAG-OX)  400 MG tablet Take 400 mg by mouth daily.      . Multiple Vitamin (MULTIVITAMIN) tablet Take 1 tablet by mouth daily.      . vitamin B-12 (CYANOCOBALAMIN) 1000 MCG tablet Take 1,000 mcg by mouth daily.     No current facility-administered medications for this visit.    Allergies-reviewed and updated No Known Allergies  Social History   Social History  . Marital Status: Married    Spouse Name: N/A  . Number of Children: N/A  . Years of Education: N/A   Social History Main Topics  . Smoking status: Never Smoker   . Smokeless tobacco: Never Used  . Alcohol Use: Yes     Comment: with dinner  . Drug Use: No  . Sexual Activity: Not Asked   Other Topics Concern  . None   Social History Narrative   Married 1979. 2 children (son '85 and daughter '88). 1 grandson '15.       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       Hobbies: paint, water ski, travel   Objective: BP 120/64 mmHg  Pulse 71  Temp(Src) 98 F (36.7 C)  Ht 5' 1.5" (1.562 m)  Wt 116 lb (52.617 kg)  BMI 21.57 kg/m2 Gen: NAD, resting  comfortably HEENT: Mucous membranes are moist. Oropharynx normal Neck: no thyromegaly CV: RRR no murmurs rubs or gallops Lungs: CTAB no crackles, wheeze, rhonchi Abdomen: soft/nontender/nondistended/normal bowel sounds. No rebound or guarding.  Ext: no edema Skin: warm, dry Neuro: grossly normal, moves all extremities, PERRLA  Assessment/Plan:  60 y.o. female presenting for annual physical.  Health Maintenance counseling: 1. Anticipatory guidance: Patient counseled regarding regular dental exams, eye exams, wearing seatbelts.  2. Risk factor reduction:  Advised patient of need for regular exercise (regular including holidays) and diet rich and fruits and vegetables to reduce risk of heart attack and stroke. Will see Dr. Gerilyn PilgrimSykes in February as well due to hyperglycemia 3. Immunizations/screenings/ancillary studies Health Maintenance Due  Topic Date  Due  . Hepatitis C Screening  Next bloodwork Apr 15, 1956  . HIV Screening  Already completed after rectal surgery 01/07/1971   4. Cervical cancer screening- hysterectomy for benign reason including cervix.  5. Breast cancer screening-  breast exam through [facility] and mammogram 07/11/14 with repeat planned this afternoon 6. Colon cancer screening - 03/11/13 with Dr. Kinnie ScalesMedoff with 5 year plan 7. Skin cancer screening- Dr. Aurora MaskStinehelfer [facility]  UTI S: treated with keflex. Grew e. Coli pan sensitive. Primarily urgency at first A/P: currently on antibiotic and resolving- finish 7 days  Osteopenia- discussed maximizing dietary intake and continuing Vit D supplement. Further counseling with Dr. Gerilyn PilgrimSykes  Does have hyponatremia mild- will repeat nonfasting within 6 months along with HCV test and a1c to recheck hyperglycemia  Return in about 1 year (around 09/13/2016) for physical.Return precautions advised.   6 months nonfasting Orders Placed This Encounter  Procedures  . Basic metabolic panel    Standing Status: Future     Number of Occurrences:      Standing Expiration Date: 09/13/2016  . Hepatitis C antibody, reflex    [facility]    Standing Status: Future     Number of Occurrences:      Standing Expiration Date: 09/13/2016  . Hemoglobin A1c    [facility]    Standing Status: Future     Number of Occurrences:      Standing Expiration Date: 09/13/2016

## 2015-10-12 ENCOUNTER — Encounter: Payer: Self-pay | Admitting: Family Medicine

## 2015-10-12 ENCOUNTER — Ambulatory Visit (INDEPENDENT_AMBULATORY_CARE_PROVIDER_SITE_OTHER): Payer: BLUE CROSS/BLUE SHIELD | Admitting: Family Medicine

## 2015-10-12 VITALS — Ht 62.75 in | Wt 113.2 lb

## 2015-10-12 DIAGNOSIS — R739 Hyperglycemia, unspecified: Secondary | ICD-10-CM | POA: Diagnosis not present

## 2015-10-12 NOTE — Patient Instructions (Addendum)
Managing Blood Glucose:  - Micro-breaks:  For every 60 min you sit, move for at least 5 min OR move 2 minutes for every 20 you sit.   - Continue to keep veg's a major (at least half your meal volume) portion of lunch and dinner.   - You might try mixing your juice with some seltzer for the Certo.   - Sources of fiber include seeds and skin of veg's and fruits.  When you eat those without either, you get less fiber.    - When you have soups, add small amounts of nonstarchy veg's, and work toward increasing the amounts you use.    - Try stir-frying some leafy greens.    - Do an Therapist, art for veg's, and pick some new ones to try.   - Bananas, watermelon, grapes, and most tropical fruits are pretty high-glycemic, so limit portions.   - All the berries, apples, pears, peaches are low-glycemic.   - Wine intake 4 X wk (vs. 7) is probably be advisable for a woman your size. - See Diabetes,org ([place] Diabetes Assn) and BodyPens.ca.    BONE HEALTH:  - Aim for a significant amount of protein (minimum of 4 oz meat/fish/poultry) at each meal (25-40 grams of protein).   - Calcium sources:  Yogurt, fortified soymilk, sesame seeds (tahini and/or hummus), almonds, almond butter, leafy greens, (canned salmon with bones, i.e., salmon patties), Ca-fortified soy foods (tofu, i.e., in smoothies).  - Do an Internet search for Ca sources to see if there are any other foods you want to add.   - Get your vit D level checked.    Email with Qs: [email or web address].

## 2015-10-12 NOTE — Progress Notes (Signed)
Medical Nutrition Therapy:  Appt start time: 1000 end time:  1100.  Assessment:  Primary concerns today: Blood sugar control and diet for optimal bone health.    Monique Moore was referred by Dr. Annice Pih for high BG levels, and he also wants her to learn how to get more dietary Ca.  Her mother has osteoporosis, and although she has not been diagnosed with osteoporosis, [name] knows that she has lost height, and wants to be proactive.  Weight stays stable between 112 and 117 lb.  She does exercise daily, but has days where she is sedentary for several hours at a time.  She is a retired Airline pilot, but also Mining engineer, and plays keyboard.    Learning Readiness: In progress.    Usual eating pattern includes 3 meals and 0-1 snacks per day. Frequent foods and beverages include blk coffee, water, 4 oz grape juice with Certo, 1 glass wine.  Avoided foods include lettuce, milk, cruciferous veg's, Asian foods, fried foods d/t digestive pblms.   Usual physical activity includes 35 min elliptical 6-7 X wk (after breakfast), 10-15 min stretching 7 X wk, ~20 min weights 3 X wk.  24-hr recall: (Up at 7:30 AM) B (8 AM)-   1 c ff van yogurt, 1/4 c cashews, 1 banana, 1 c blk coffee Snk ( AM)-    L (11:30 PM)-  1 c tomato soup, 1 oz cheddar, 4 Triscuits, water Snk ( PM)-   D (5:30 PM)-  [place] takeout: 1 c lamb-spinach dish, 1/4 c rice, small pc naan, 1 glass wine, water Snk ( PM)-   Typical day? No. Usually has 1 c plain yogurt and 1 c berries or 1 banana smoothie for bkfst; was at her daughter's.  Usually eats takeout/rest food only once a week.  Typical lunch is chx & veg's; dinner is protein food with veg's and limited starch (potatoes, rice, pasta).     Nutritional Diagnosis:  NB-1.1 Food and nutrition-related knowledge deficit As related to best diet for optimal bone health and BG control .  As evidenced by self-report of confusion from available information.    Intervention:  Nutrition  education.  Handouts given during visit include:  AVS  Demonstrated degree of understanding via:  Teach Back   Monitoring/Evaluation:  Dietary intake, exercise, and body weight prn.

## 2015-10-18 ENCOUNTER — Encounter: Payer: Self-pay | Admitting: Family Medicine

## 2015-10-19 ENCOUNTER — Other Ambulatory Visit: Payer: Self-pay | Admitting: Family Medicine

## 2015-10-19 DIAGNOSIS — Z0000 Encounter for general adult medical examination without abnormal findings: Secondary | ICD-10-CM

## 2015-10-19 DIAGNOSIS — M8580 Other specified disorders of bone density and structure, unspecified site: Secondary | ICD-10-CM

## 2015-10-19 NOTE — Telephone Encounter (Signed)
May add Vitamin D.   I am going to forward patient concern about me not being on list of providers for blue cross to [facility] as well

## 2016-02-25 ENCOUNTER — Telehealth: Payer: Self-pay | Admitting: Family Medicine

## 2016-02-25 NOTE — Telephone Encounter (Signed)
Pt would like to know if she is to be fasting for her A1C because she said you stated to fast for that lab and for the sodium not to fast.  Pt would like to have clarification so that she can do it on July 17 or 18.

## 2016-02-26 ENCOUNTER — Other Ambulatory Visit: Payer: Self-pay

## 2016-02-26 DIAGNOSIS — Z20828 Contact with and (suspected) exposure to other viral communicable diseases: Secondary | ICD-10-CM

## 2016-02-26 DIAGNOSIS — R739 Hyperglycemia, unspecified: Secondary | ICD-10-CM

## 2016-02-26 DIAGNOSIS — E871 Hypo-osmolality and hyponatremia: Secondary | ICD-10-CM

## 2016-03-02 ENCOUNTER — Encounter: Payer: Self-pay | Admitting: Family Medicine

## 2016-03-14 ENCOUNTER — Other Ambulatory Visit (INDEPENDENT_AMBULATORY_CARE_PROVIDER_SITE_OTHER): Payer: BLUE CROSS/BLUE SHIELD

## 2016-03-14 DIAGNOSIS — R739 Hyperglycemia, unspecified: Secondary | ICD-10-CM | POA: Diagnosis not present

## 2016-03-14 DIAGNOSIS — Z20828 Contact with and (suspected) exposure to other viral communicable diseases: Secondary | ICD-10-CM

## 2016-03-14 DIAGNOSIS — M8580 Other specified disorders of bone density and structure, unspecified site: Secondary | ICD-10-CM

## 2016-03-14 DIAGNOSIS — E871 Hypo-osmolality and hyponatremia: Secondary | ICD-10-CM | POA: Diagnosis not present

## 2016-03-14 DIAGNOSIS — Z0000 Encounter for general adult medical examination without abnormal findings: Secondary | ICD-10-CM | POA: Diagnosis not present

## 2016-03-15 LAB — COMPREHENSIVE METABOLIC PANEL
ALBUMIN: 4.2 g/dL (ref 3.5–5.2)
ALT: 13 U/L (ref 0–35)
AST: 21 U/L (ref 0–37)
Alkaline Phosphatase: 85 U/L (ref 39–117)
BILIRUBIN TOTAL: 0.4 mg/dL (ref 0.2–1.2)
BUN: 17 mg/dL (ref 6–23)
CALCIUM: 9.3 mg/dL (ref 8.4–10.5)
CHLORIDE: 98 meq/L (ref 96–112)
CO2: 26 meq/L (ref 19–32)
CREATININE: 0.95 mg/dL (ref 0.40–1.20)
GFR: 63.74 mL/min (ref >60.00)
Glucose, Bld: 108 mg/dL — ABNORMAL HIGH (ref 70–99)
Potassium: 5.2 mEq/L — ABNORMAL HIGH (ref 3.5–5.1)
Sodium: 132 mEq/L — ABNORMAL LOW (ref 135–145)
Total Protein: 6.4 g/dL (ref 6.0–8.3)

## 2016-03-15 LAB — HEMOGLOBIN A1C: HEMOGLOBIN A1C: 5.9 % (ref 4.6–6.5)

## 2016-03-15 LAB — HEPATITIS C ANTIBODY: HCV Ab: NEGATIVE

## 2016-03-15 LAB — VITAMIN D 25 HYDROXY (VIT D DEFICIENCY, FRACTURES): VITD: 40.09 ng/mL (ref 30.00–100.00)

## 2016-06-27 ENCOUNTER — Ambulatory Visit (INDEPENDENT_AMBULATORY_CARE_PROVIDER_SITE_OTHER): Payer: BLUE CROSS/BLUE SHIELD | Admitting: *Deleted

## 2016-06-27 DIAGNOSIS — Z23 Encounter for immunization: Secondary | ICD-10-CM | POA: Diagnosis not present

## 2016-08-30 ENCOUNTER — Other Ambulatory Visit: Payer: Self-pay | Admitting: Obstetrics and Gynecology

## 2016-08-30 DIAGNOSIS — Z1231 Encounter for screening mammogram for malignant neoplasm of breast: Secondary | ICD-10-CM

## 2016-10-12 ENCOUNTER — Ambulatory Visit
Admission: RE | Admit: 2016-10-12 | Discharge: 2016-10-12 | Disposition: A | Discharge status: Home / Self Care | Payer: BLUE CROSS/BLUE SHIELD | Source: Ambulatory Visit | Attending: Obstetrics and Gynecology | Admitting: Obstetrics and Gynecology

## 2016-10-12 DIAGNOSIS — Z1231 Encounter for screening mammogram for malignant neoplasm of breast: Secondary | ICD-10-CM

## 2016-10-24 ENCOUNTER — Other Ambulatory Visit (INDEPENDENT_AMBULATORY_CARE_PROVIDER_SITE_OTHER): Payer: BLUE CROSS/BLUE SHIELD

## 2016-10-24 DIAGNOSIS — Z0000 Encounter for general adult medical examination without abnormal findings: Secondary | ICD-10-CM

## 2016-10-24 LAB — LIPID PANEL
CHOLESTEROL: 219 mg/dL — AB (ref 0–200)
HDL: 100.9 mg/dL (ref >39.00)
LDL Cholesterol: 103 mg/dL — ABNORMAL HIGH (ref 0–99)
NonHDL: 117.91
TRIGLYCERIDES: 75 mg/dL (ref 0.0–149.0)
Total CHOL/HDL Ratio: 2
VLDL: 15 mg/dL (ref 0.0–40.0)

## 2016-10-24 LAB — BASIC METABOLIC PANEL
BUN: 11 mg/dL (ref 6–23)
CALCIUM: 9.6 mg/dL (ref 8.4–10.5)
CHLORIDE: 103 meq/L (ref 96–112)
CO2: 27 meq/L (ref 19–32)
Creatinine, Ser: 0.83 mg/dL (ref 0.40–1.20)
GFR: 74.33 mL/min (ref >60.00)
Glucose, Bld: 103 mg/dL — ABNORMAL HIGH (ref 70–99)
POTASSIUM: 4.2 meq/L (ref 3.5–5.1)
Sodium: 140 mEq/L (ref 135–145)

## 2016-10-24 LAB — POC URINALSYSI DIPSTICK (AUTOMATED)
BILIRUBIN UA: NEGATIVE
GLUCOSE UA: NEGATIVE
Ketones, UA: NEGATIVE
Leukocytes, UA: NEGATIVE
Nitrite, UA: NEGATIVE
PH UA: 6.5
PROTEIN UA: NEGATIVE
RBC UA: NEGATIVE
SPEC GRAV UA: 1.015
Urobilinogen, UA: 0.2

## 2016-10-24 LAB — CBC WITH DIFFERENTIAL/PLATELET
BASOS ABS: 0 10*3/uL (ref 0.0–0.1)
Basophils Relative: 0.9 % (ref 0.0–3.0)
Eosinophils Absolute: 0.2 10*3/uL (ref 0.0–0.7)
Eosinophils Relative: 4 % (ref 0.0–5.0)
HCT: 41.8 % (ref 36.0–46.0)
Hemoglobin: 14 g/dL (ref 12.0–15.0)
LYMPHS PCT: 31.6 % (ref 12.0–46.0)
Lymphs Abs: 1.8 10*3/uL (ref 0.7–4.0)
MCHC: 33.4 g/dL (ref 30.0–36.0)
MCV: 89.4 fl (ref 78.0–100.0)
MONOS PCT: 8.4 % (ref 3.0–12.0)
Monocytes Absolute: 0.5 10*3/uL (ref 0.1–1.0)
NEUTROS ABS: 3.2 10*3/uL (ref 1.4–7.7)
Neutrophils Relative %: 55.1 % (ref 43.0–77.0)
PLATELETS: 305 10*3/uL (ref 150.0–400.0)
RBC: 4.68 Mil/uL (ref 3.87–5.11)
RDW: 13.1 % (ref 11.5–15.5)
WBC: 5.7 10*3/uL (ref 4.0–10.5)

## 2016-10-24 LAB — HEPATIC FUNCTION PANEL
ALBUMIN: 4.3 g/dL (ref 3.5–5.2)
ALK PHOS: 86 U/L (ref 39–117)
ALT: 12 U/L (ref 0–35)
AST: 19 U/L (ref 0–37)
Bilirubin, Direct: 0.1 mg/dL (ref 0.0–0.3)
TOTAL PROTEIN: 6.5 g/dL (ref 6.0–8.3)
Total Bilirubin: 0.7 mg/dL (ref 0.2–1.2)

## 2016-10-24 LAB — TSH: TSH: 3.17 u[IU]/mL (ref 0.35–4.50)

## 2016-10-27 ENCOUNTER — Ambulatory Visit (INDEPENDENT_AMBULATORY_CARE_PROVIDER_SITE_OTHER): Payer: BLUE CROSS/BLUE SHIELD | Admitting: Family Medicine

## 2016-10-27 ENCOUNTER — Encounter: Payer: Self-pay | Admitting: Family Medicine

## 2016-10-27 VITALS — BP 116/70 | HR 72 | Temp 98.3°F | Ht 63.0 in | Wt 116.0 lb

## 2016-10-27 DIAGNOSIS — Z0000 Encounter for general adult medical examination without abnormal findings: Secondary | ICD-10-CM | POA: Diagnosis not present

## 2016-10-27 MED ORDER — CETIRIZINE HCL 10 MG PO TABS: 10.0000 mg | ORAL_TABLET | Freq: Every day | ORAL | 3 refills | Status: DC

## 2016-10-27 NOTE — Progress Notes (Signed)
Pre visit review using our clinic review tool, if applicable. No additional management support is needed unless otherwise documented below in the visit note. 

## 2016-10-27 NOTE — Progress Notes (Signed)
Phone: [phone]  Subjective:  Patient presents today for their annual physical. Chief complaint-noted.   See problem oriented charting- ROS- full  review of systems was completed and negative except for:  Some toe pain after hitting toe on tub 6 weeks ago- improving  The following were reviewed and entered/updated in epic: Past Medical History:  Diagnosis Date  . Endometriosis 2000   endometriosis related, pain with intercourse  . IBS (irritable bowel syndrome)    ? improved with diet changes   Patient Active Problem List   Diagnosis Date Noted  . Hyperglycemia 08/12/2014    Priority: Medium  . Thumb swelling 07/12/2014    Priority: Medium  . Osteopenia 08/12/2014    Priority: Low  . Allergic rhinitis 08/12/2014    Priority: Low  . MRI >1.5 T contraindicated due to metal implant 07/16/2014    Priority: Low   Past Surgical History:  Procedure Laterality Date  . ABDOMINAL HYSTERECTOMY  2003   benign, cervix included  . FOOT SURGERY     Dr. Lestine BoxBednarz [place] [facility]  . HEMORRHOID SURGERY     growth on hemorrhoid-benign  . LAPAROTOMY  11/00   eval endometriosis  . OOPHORECTOMY    . ORIF ELBOW FRACTURE  2011   performed in [place], hiking  . STAPEDECTOMY  2003   otosclerosis L    Family History  Problem Relation Age of Onset  . Atrial fibrillation Mother   . Hypertension Mother   . Arthritis Mother   . Osteoporosis Mother   . Cerebral aneurysm Father     hemorrhage  . Diabetes Father   . Hypertension Brother   . Diabetes Brother     Medications- reviewed and updated Current Outpatient Prescriptions  Medication Sig Dispense Refill  . BIOTIN PO Take by mouth.    . calcium-vitamin D 250-100 MG-UNIT per tablet Take 1 tablet by mouth 3 (three) times daily.      . cetirizine (ZYRTEC) 10 MG tablet Take 1 tablet (10 mg total) by mouth daily. 90 tablet 3  . magnesium oxide (MAG-OX) 400 MG tablet Take 400 mg by mouth once a week.     . Multiple Vitamin (MULTIVITAMIN)  tablet Take 1 tablet by mouth daily.      . vitamin B-12 (CYANOCOBALAMIN) 1000 MCG tablet Take 1,000 mcg by mouth daily.     No current facility-administered medications for this visit.     Allergies-reviewed and updated No Known Allergies  Social History   Social History  . Marital status: Married    Spouse name: N/A  . Number of children: N/A  . Years of education: N/A   Social History Main Topics  . Smoking status: Never Smoker  . Smokeless tobacco: Never Used  . Alcohol use Yes     Comment: with dinner  . Drug use: No  . Sexual activity: Not Asked   Other Topics Concern  . None   Social History Narrative   Married 1979. 2 children (son '85 and daughter '88). 1 grandson '15.       Retired. 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       Hobbies: paint, water ski, travel    Objective: BP 116/70 (BP Location: Left Arm, Patient Position: Sitting, Cuff Size: Normal)   Pulse 72   Temp 98.3 F (36.8 C) (Oral)   Ht 5\' 3"  (1.6 m)   Wt 116 lb (52.6 kg)   SpO2 99%   BMI 20.55 kg/m  Gen: NAD, resting comfortably HEENT:  Mucous membranes are moist. Oropharynx normal Neck: no thyromegaly CV: RRR no murmurs rubs or gallops Lungs: CTAB no crackles, wheeze, rhonchi Abdomen: soft/nontender/nondistended/normal bowel sounds. No rebound or guarding.  Ext: no edema Skin: warm, dry Neuro: grossly normal, moves all extremities, PERRLA  Breast and pelvic declined to GYN  Assessment/Plan:  61 y.o. female presenting for annual physical.  Health Maintenance counseling: 1. Anticipatory guidance: Patient counseled regarding regular dental exams q6 months, eye exams - next week, wearing seatbelts.  2. Risk factor reduction:  Advised patient of need for regular exercise and diet rich and fruits and vegetables to reduce risk of heart attack and stroke. Exercise- excellent. Diet-weight reasonable.  Wt Readings from Last 3 Encounters:  10/27/16 116 lb (52.6 kg)    10/12/15 113 lb 3.2 oz (51.3 kg)  09/14/15 116 lb (52.6 kg)  3. Immunizations/screenings/ancillary studies- shingrix next year. Pneumonia shot starting age 58 Immunization History  Administered Date(s) Administered  . Influenza Split 06/10/2011, 05/23/2012  . Influenza Whole 07/06/2007, 06/05/2008, 05/28/2009, 07/14/2010  . Influenza,inj,Quad PF,36+ Mos 06/11/2013, 06/04/2014, 06/12/2015, 06/27/2016  . Tdap 12/10/2009  4. Cervical cancer screening- hysterectomy for benign reason- no pap indicated because no cervix- does intermittently get PAPS 5. Breast cancer screening-  breast exam with Dr. Huntley Dec and mammogram 10/12/16 6. Colon cancer screening - 03/11/13 with 5 year repeat 7. Skin cancer screening- Dr. Leonie Man [facility] [facility]. Once a year.   Status of chronic or acute concerns  Osteopenia- dietary counseling with Dr. Gerilyn Pilgrim. Also on vitamin D . Added some soy milk in afternoon for vitamin D. Good job weight bearing exercise. Had been on actonel in the past for 5 years. Defers exam to next year.   Hyperglycemia- saw Dr. Gerilyn Pilgrim for help balancing this. This level was down from the 6.2 previously. Has avoided lows- handful of nuts if hungry.  Lab Results  Component Value Date   HGBA1C 5.9 03/14/2016   Doing really well!!!  Return in about 1 year (around 10/27/2017) for physical.  For allergies- using zyrtec- needs rx for hsa Meds ordered this encounter  Medications  . cetirizine (ZYRTEC) 10 MG tablet    Sig: Take 1 tablet (10 mg total) by mouth daily.    Dispense:  90 tablet    Refill:  3   Return precautions advised.  Tana Conch, MD

## 2016-10-27 NOTE — Patient Instructions (Addendum)
For next years physical or with flu shot next year- call insurance and see if they cover shingrix in office (new shingles shot)  Things really look great.   ______________________________________________________________________  Starting October 1st 2018, I will be transferring to our new location:  [facility] [address] ([address]- [place] from [address]) [place], [place] [id number] Phone: [phone]  I would love to have you remain my patient at this new location as long as it remains convenient for you. I am excited about the opportunity to have x-ray and sports medicine in the new building but will really miss the awesome staff and physicians at [facility]. Continue to schedule [facility] at [facility] and we will automatically transfer them to the [facility] location starting October 1st.

## 2016-12-14 ENCOUNTER — Other Ambulatory Visit: Payer: BLUE CROSS/BLUE SHIELD

## 2016-12-19 ENCOUNTER — Encounter: Payer: BLUE CROSS/BLUE SHIELD | Admitting: Family Medicine

## 2017-01-24 ENCOUNTER — Telehealth: Payer: BLUE CROSS/BLUE SHIELD | Admitting: Family

## 2017-01-24 DIAGNOSIS — N390 Urinary tract infection, site not specified: Secondary | ICD-10-CM

## 2017-01-24 DIAGNOSIS — A499 Bacterial infection, unspecified: Secondary | ICD-10-CM

## 2017-01-24 MED ORDER — NITROFURANTOIN MONOHYD MACRO 100 MG PO CAPS: 100.0000 mg | ORAL_CAPSULE | Freq: Two times a day (BID) | ORAL | 0 refills | Status: DC

## 2017-01-24 NOTE — Progress Notes (Signed)

## 2017-06-05 ENCOUNTER — Ambulatory Visit (INDEPENDENT_AMBULATORY_CARE_PROVIDER_SITE_OTHER): Payer: BLUE CROSS/BLUE SHIELD

## 2017-06-05 DIAGNOSIS — Z23 Encounter for immunization: Secondary | ICD-10-CM | POA: Diagnosis not present

## 2017-09-05 ENCOUNTER — Other Ambulatory Visit: Payer: Self-pay | Admitting: Obstetrics and Gynecology

## 2017-09-05 DIAGNOSIS — Z139 Encounter for screening, unspecified: Secondary | ICD-10-CM

## 2017-11-07 ENCOUNTER — Ambulatory Visit
Admission: RE | Admit: 2017-11-07 | Discharge: 2017-11-07 | Disposition: A | Discharge status: Home / Self Care | Payer: BLUE CROSS/BLUE SHIELD | Source: Ambulatory Visit | Attending: Obstetrics and Gynecology | Admitting: Obstetrics and Gynecology

## 2017-11-07 DIAGNOSIS — Z139 Encounter for screening, unspecified: Secondary | ICD-10-CM

## 2017-11-10 ENCOUNTER — Encounter: Payer: Self-pay | Admitting: Family Medicine

## 2017-11-15 ENCOUNTER — Other Ambulatory Visit: Payer: Self-pay

## 2017-11-15 MED ORDER — CETIRIZINE HCL 10 MG PO TABS: 10.0000 mg | ORAL_TABLET | Freq: Every day | ORAL | 3 refills | Status: AC

## 2017-12-07 ENCOUNTER — Ambulatory Visit (INDEPENDENT_AMBULATORY_CARE_PROVIDER_SITE_OTHER): Payer: BLUE CROSS/BLUE SHIELD | Admitting: Sports Medicine

## 2017-12-07 ENCOUNTER — Encounter: Payer: Self-pay | Admitting: Sports Medicine

## 2017-12-07 DIAGNOSIS — M2170 Unequal limb length (acquired), unspecified site: Secondary | ICD-10-CM | POA: Diagnosis not present

## 2017-12-07 NOTE — Progress Notes (Signed)
   HPI  CC: Leg length discrepancy Patient is here for new custom orthotics.  She was last given custom orthotics approximately 10 years ago.  Patient has a known leg length discrepancy of approximately 1 cm in which her left leg is longer than the right.  She endorses excellent symptomatic improvement since receiving the orthotics 10 years ago.  No longer has chronic lateral hp pain.  She denies any new injury or trauma.  No weakness, numbness, or paresthesias in either of her legs or feet.  She states that she is able to be as active as she would like to be as long as she is able to wear shoes with her old orthotics in them.  Medications/Interventions Tried: Museum/gallery exhibitions officerCustom orthotics with a lift in the right side.  See HPI and/or previous note for associated ROS.  Objective: BP 118/78   Ht 5\' 3"  (1.6 m)   Wt 113 lb (51.3 kg)   BMI 20.02 kg/m  Gen: NAD, well groomed, a/o x3, normal affect.  CV: Well-perfused. Warm.  Resp: Non-labored.  Neuro: Sensation intact throughout. No gross coordination deficits.  Gait: Nonpathologic posture, unremarkable stride without signs of limp or balance issues. Ankle/Foot, Bilateral: TTP not currently present. No visible erythema, swelling, ecchymosis, or bony deformity. No notable pes planus/cavus deformity; Range of motion is full in all directions. Strength is 5/5 in all directions. No peroneal tendon tenderness or subluxation; Stable lateral and medial ligaments; Talar dome nontender; No plantar calcaneal tenderness; No tenderness at the distal metatarsals; Able to walk 4 steps.  Leg Length: Left leg approximately 1 cm longer than the right.  Custom Orthotics: - Patient was fitted for a standard, cushioned, semi-rigid orthotic. - Orthotic was heated and afterward the patient stood on the orthotic blank positioned on the orthotic stand. - The patient was positioned in subtalar neutral position and 10 degrees of ankle dorsiflexion in a weight bearing stance. -  After completion of molding, a stable base was applied to the orthotic blank. - The blank was ground to a stable position for weight bearing. - Size: 6 - Base: Blue EVA - Additional Posting and Padding: Right-sided black heel wedge added to the bottom of the [employer] EVA - The patient ambulated in these, and they were very comfortable.  Pre orthotic gait with rapid lateral hip thrust on RT and slight trendelenburg. Post orthotic preparation with lift this is neutralized on gait.  Assessment and plan:  Leg length discrepancy Patient is here with a history of left leg pain that had been treated successfully with custom orthotics and a heel wedge to correct a leg length discrepancy.  Patient would like new orthotics as her old orthotics are approximately 62 years old. -New orthotics made today.  Patient endorsed good comfort. -Follow-up as needed  I spent 30 minutes with this patient. Over 50% of visit was spent in counseling and coordination of care for problems with a leg length discrepancy.   Monique DeltonIan D McKeag, MD,MS [facility] Fellow 12/07/2017 5:49 PM  I observed and examined the patient with the [facility] and agree with assessment and plan.  Note reviewed and modified by me. Enid BaasKarl Maryela Tapper, MD

## 2017-12-07 NOTE — Assessment & Plan Note (Signed)
Patient is here with a history of left leg pain that had been treated successfully with custom orthotics and a heel wedge to correct a leg length discrepancy.  Patient would like new orthotics as her old orthotics are approximately 62 years old. -New orthotics made today.  Patient endorsed good comfort. -Follow-up as needed

## 2018-01-10 LAB — HM DEXA SCAN

## 2018-01-16 ENCOUNTER — Encounter: Payer: Self-pay | Admitting: Family Medicine

## 2018-04-24 ENCOUNTER — Encounter: Payer: Self-pay | Admitting: Family Medicine

## 2018-05-03 ENCOUNTER — Encounter: Payer: Self-pay | Admitting: Family Medicine

## 2018-05-03 ENCOUNTER — Encounter: Payer: BLUE CROSS/BLUE SHIELD | Admitting: Family Medicine

## 2018-05-03 ENCOUNTER — Ambulatory Visit (INDEPENDENT_AMBULATORY_CARE_PROVIDER_SITE_OTHER): Payer: BLUE CROSS/BLUE SHIELD | Admitting: Family Medicine

## 2018-05-03 VITALS — BP 128/80 | HR 70 | Temp 97.9°F | Ht 63.0 in | Wt 116.6 lb

## 2018-05-03 DIAGNOSIS — Z0000 Encounter for general adult medical examination without abnormal findings: Secondary | ICD-10-CM | POA: Diagnosis not present

## 2018-05-03 DIAGNOSIS — Z90710 Acquired absence of both cervix and uterus: Secondary | ICD-10-CM

## 2018-05-03 DIAGNOSIS — R739 Hyperglycemia, unspecified: Secondary | ICD-10-CM | POA: Diagnosis not present

## 2018-05-03 DIAGNOSIS — Z23 Encounter for immunization: Secondary | ICD-10-CM

## 2018-05-03 DIAGNOSIS — M810 Age-related osteoporosis without current pathological fracture: Secondary | ICD-10-CM

## 2018-05-03 DIAGNOSIS — E785 Hyperlipidemia, unspecified: Secondary | ICD-10-CM | POA: Diagnosis not present

## 2018-05-03 LAB — COMPREHENSIVE METABOLIC PANEL
ALT: 13 U/L (ref 0–35)
AST: 20 U/L (ref 0–37)
Albumin: 4.5 g/dL (ref 3.5–5.2)
Alkaline Phosphatase: 77 U/L (ref 39–117)
BILIRUBIN TOTAL: 0.8 mg/dL (ref 0.2–1.2)
BUN: 14 mg/dL (ref 6–23)
CO2: 28 meq/L (ref 19–32)
CREATININE: 0.85 mg/dL (ref 0.40–1.20)
Calcium: 9.3 mg/dL (ref 8.4–10.5)
Chloride: 101 mEq/L (ref 96–112)
GFR: 71.95 mL/min (ref >60.00)
GLUCOSE: 99 mg/dL (ref 70–99)
Potassium: 4.8 mEq/L (ref 3.5–5.1)
Sodium: 135 mEq/L (ref 135–145)
Total Protein: 6.7 g/dL (ref 6.0–8.3)

## 2018-05-03 LAB — CBC
HCT: 41.2 % (ref 36.0–46.0)
HEMOGLOBIN: 14 g/dL (ref 12.0–15.0)
MCHC: 34 g/dL (ref 30.0–36.0)
MCV: 88.4 fl (ref 78.0–100.0)
PLATELETS: 275 10*3/uL (ref 150.0–400.0)
RBC: 4.66 Mil/uL (ref 3.87–5.11)
RDW: 13.6 % (ref 11.5–15.5)
WBC: 5.8 10*3/uL (ref 4.0–10.5)

## 2018-05-03 LAB — TSH: TSH: 2.62 u[IU]/mL (ref 0.35–4.50)

## 2018-05-03 LAB — LIPID PANEL
CHOL/HDL RATIO: 2
Cholesterol: 206 mg/dL — ABNORMAL HIGH (ref 0–200)
HDL: 94.5 mg/dL (ref >39.00)
LDL Cholesterol: 94 mg/dL (ref 0–99)
NONHDL: 111.89
Triglycerides: 90 mg/dL (ref 0.0–149.0)
VLDL: 18 mg/dL (ref 0.0–40.0)

## 2018-05-03 LAB — HEMOGLOBIN A1C: HEMOGLOBIN A1C: 6.1 % (ref 4.6–6.5)

## 2018-05-03 MED ORDER — TRIAMCINOLONE ACETONIDE 0.5 % EX OINT: 1.0000 "application " | TOPICAL_OINTMENT | Freq: Two times a day (BID) | CUTANEOUS | 0 refills | Status: DC

## 2018-05-03 NOTE — Addendum Note (Signed)
Addended by: Vicente Males on: 05/03/2018 09:33 AM   Modules accepted: Orders

## 2018-05-03 NOTE — Progress Notes (Signed)
Phone: [phone]  Subjective:  Patient presents today for their annual physical. Chief complaint-noted.   See problem oriented charting- ROS- full  review of systems was completed and negative except for: dryness in scalp, seasonal allergies, back pain, neck looks different  The following were reviewed and entered/updated in epic: Past Medical History:  Diagnosis Date  . Endometriosis 2000   endometriosis related, pain with intercourse  . IBS (irritable bowel syndrome)    ? improved with diet changes   Patient Active Problem List   Diagnosis Date Noted  . Hyperglycemia 08/12/2014    Priority: Medium  . Thumb swelling 07/12/2014    Priority: Medium  . Osteoporosis 08/12/2014    Priority: Low  . Allergic rhinitis 08/12/2014    Priority: Low  . MRI >1.5 T contraindicated due to metal implant 07/16/2014    Priority: Low  . History of total hysterectomy 05/03/2018  . Leg length discrepancy 12/07/2017   Past Surgical History:  Procedure Laterality Date  . ABDOMINAL HYSTERECTOMY  2003   benign, cervix included  . FOOT SURGERY     Dr. Lestine Box [facility] [facility]  . HEMORRHOID SURGERY     growth on hemorrhoid-benign  . LAPAROTOMY  11/00   eval endometriosis  . OOPHORECTOMY    . ORIF ELBOW FRACTURE  2011   performed in [place], hiking  . STAPEDECTOMY  2003   otosclerosis L    Family History  Problem Relation Age of Onset  . Atrial fibrillation Mother   . Hypertension Mother   . Arthritis Mother   . Osteoporosis Mother   . Cerebral aneurysm Father        hemorrhage  . Diabetes Father   . Hypertension Brother   . Diabetes Brother     Medications- reviewed and updated Current Outpatient Medications  Medication Sig Dispense Refill  . BIOTIN PO Take by mouth.    . cetirizine (ZYRTEC) 10 MG tablet Take 1 tablet (10 mg total) by mouth daily. 90 tablet 3  . Lactobacillus (PROBIOTIC ACIDOPHILUS PO) Take 1 tablet by mouth daily.    . magnesium oxide (MAG-OX) 400 MG tablet  Take 400 mg by mouth once a week.     . Multiple Vitamin (MULTIVITAMIN) tablet Take 1 tablet by mouth daily.      . risedronate (ACTONEL) 35 MG tablet Take 1 tablet by mouth once a week.  10  . triamcinolone ointment (KENALOG) 0.5 % Apply 1 application topically 2 (two) times daily. For 10 days to spot on scalp. If helps but doesn't resolve can repeat after 10 days off 15 g 0   No current facility-administered medications for this visit.     Allergies-reviewed and updated Allergies  Allergen Reactions  . Codeine Nausea And Vomiting    Social History   Social History Narrative   Married 1979. 2 children (son '85 and daughter '88). 1 grandson '15.       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       Hobbies: paint, water ski, travel    Objective: BP 128/80 (BP Location: Left Arm, Patient Position: Sitting, Cuff Size: Normal)   Pulse 70   Temp 97.9 F (36.6 C) (Oral)   Ht 5\' 3"  (1.6 m)   Wt 116 lb 9.6 oz (52.9 kg)   SpO2 98%   BMI 20.65 kg/m  Gen: NAD, resting comfortably HEENT: Mucous membranes are moist. Oropharynx normal Neck: no thyromegaly CV: RRR no murmurs rubs or gallops Lungs: CTAB no crackles, wheeze, rhonchi  Abdomen: soft/nontender/nondistended/normal bowel sounds. No rebound or guarding.  Ext: no edema Skin: warm, dry Neuro: grossly normal, moves all extremities, PERRLA  Assessment/Plan:  61 y.o. female presenting for annual physical.  Health Maintenance counseling: 1. Anticipatory guidance: Patient counseled regarding regular dental exams -q6 months, eye exams - every 2 years, wearing seatbelts.  2. Risk factor reduction:  Advised patient of need for regular exercise and diet rich and fruits and vegetables to reduce risk of heart attack and stroke. Exercise- 7 days a week. Diet-very healthy diet.  Wt Readings from Last 3 Encounters:  05/03/18 116 lb 9.6 oz (52.9 kg)  12/07/17 113 lb (51.3 kg)  10/27/16 116 lb (52.6 kg)  3.  Immunizations/screenings/ancillary studies- flu shot given today. shingrix started today  With repeat in 2-5 months.  Immunization History  Administered Date(s) Administered  . Influenza Split 06/10/2011, 05/23/2012  . Influenza Whole 07/06/2007, 06/05/2008, 05/28/2009, 07/14/2010  . Influenza,inj,Quad PF,6+ Mos 06/11/2013, 06/04/2014, 06/12/2015, 06/27/2016, 06/05/2017, 05/03/2018  . Tdap 12/10/2009   4. Cervical cancer screening- hysterectomy for benign reason- no pap indicated because no cervix- gets intermittent PAPs with GYN 5. Breast cancer screening-  breast exam with Dr. Huntley Dec and mammogram 11/07/17 6. Colon cancer screening - repeat colonoscopy scheduled for October. This is a 5 year repeat with Dr. Kinnie Scales 7. Skin cancer screening- sees Dr. Leonie Man [place] [facility]- sees in May. advised regular sunscreen use. Denies worrisome, changing, or new skin lesions.  8. Birth control/STD check- postmenopausal plus hysterectomy and mongogomous  Status of chronic or acute concerns   Some dryness in scalp- whitish discoloration without red base. Possible excoriated seborrheic keratosis. Could be AK but no red base. Will trial steroid cream- does appear to pick at some so discouraged this.   Low back pain at times -asks about osteoporosis role- doubt this is cause. No midline pain to suggest compression fracture.   LDL above 100 last year- will update lipids. She also would like thyroid tested as possible cause. She feels her laryngeal prominence is more prominent- I dont note this today- she will return if feels continues to enlarge   Osteoporosis Update from GYN:" Dr. Henderson Cloud, my gynecologist prescribed Risedronate Sodium 35 mg once a week in May. The bone density test in May showed osteoporosis of the spine. " still doing vitamin D, doing calcium through diet- has seen Dr. Gerilyn Pilgrim in past- soy milk and yogurt daily  Hyperglycemia Hyperglycemia also with history of lows- saw Dr. Gerilyn Pilgrim for help  balancing this. This level was down to 5.9 last tyear from the 6.2 previously. Has avoided lows- handful of nuts if hungry. update a1c today   1 year physical  Lab/Order associations: Preventative health care - Plan: CBC, Comprehensive metabolic panel, Lipid panel, TSH, Hemoglobin A1c  Hyperglycemia - Plan: Hemoglobin A1c  Need for prophylactic vaccination and inoculation against influenza - Plan: Flu Vaccine QUAD 36+ mos IM  History of total hysterectomy  Hyperlipidemia, unspecified hyperlipidemia type - Plan: CBC, Comprehensive metabolic panel, Lipid panel, TSH  Age-related osteoporosis without current pathological fracture  Meds ordered this encounter  Medications  . triamcinolone ointment (KENALOG) 0.5 %    Sig: Apply 1 application topically 2 (two) times daily. For 10 days to spot on scalp. If helps but doesn't resolve can repeat after 10 days off    Dispense:  15 g    Refill:  0   Return precautions advised.  Tana Conch, MD

## 2018-05-03 NOTE — Assessment & Plan Note (Signed)
Update from GYN:" Dr. Henderson Cloud, my gynecologist prescribed Risedronate Sodium 35 mg once a week in May. The bone density test in May showed osteoporosis of the spine. " still doing vitamin D, doing calcium through diet- has seen Dr. Gerilyn Pilgrim in past- soy milk and yogurt daily

## 2018-05-03 NOTE — Assessment & Plan Note (Signed)
Hyperglycemia also with history of lows- saw Dr. Gerilyn Pilgrim for help balancing this. This level was down to 5.9 last tyear from the 6.2 previously. Has avoided lows- handful of nuts if hungry. update a1c today

## 2018-05-03 NOTE — Patient Instructions (Addendum)
Health Maintenance Due  Topic Date Due  . COLONOSCOPY -scheduled for October 03/11/2018  . INFLUENZA VACCINE -completed today 03/29/2018   Shingrix #1 today. Repeat injection in 2-5 months. Schedule this before you leave.   Try triamcinolone for 2 rounds on the scalp- if doesn't at least improve would try to get in to see DR. Stinehelfer  Thanks for update on bone density   Please stop by lab before you go

## 2018-06-18 LAB — HM COLONOSCOPY

## 2018-06-21 ENCOUNTER — Encounter: Payer: Self-pay | Admitting: Family Medicine

## 2018-07-10 ENCOUNTER — Ambulatory Visit (INDEPENDENT_AMBULATORY_CARE_PROVIDER_SITE_OTHER): Payer: BLUE CROSS/BLUE SHIELD

## 2018-07-10 DIAGNOSIS — Z23 Encounter for immunization: Secondary | ICD-10-CM | POA: Diagnosis not present

## 2018-07-10 NOTE — Patient Instructions (Signed)
There are no preventive care reminders to display for this patient.  Depression screen PHQ 2/9 05/03/2018  Decreased Interest 0  Down, Depressed, Hopeless 0  PHQ - 2 Score 0

## 2018-07-10 NOTE — Progress Notes (Signed)
Patient here today for second Shingrix vaccine. Administered in left arm. VIS given. Patient tolerated well.

## 2018-07-26 ENCOUNTER — Telehealth: Payer: BLUE CROSS/BLUE SHIELD | Admitting: Family

## 2018-07-26 DIAGNOSIS — N390 Urinary tract infection, site not specified: Secondary | ICD-10-CM

## 2018-07-26 MED ORDER — CEPHALEXIN 500 MG PO CAPS: 500.0000 mg | ORAL_CAPSULE | Freq: Two times a day (BID) | ORAL | 0 refills | Status: DC

## 2018-07-26 NOTE — Progress Notes (Signed)

## 2018-09-14 ENCOUNTER — Encounter: Payer: Self-pay | Admitting: Family Medicine

## 2018-09-14 ENCOUNTER — Other Ambulatory Visit: Payer: Self-pay

## 2018-09-14 DIAGNOSIS — H9192 Unspecified hearing loss, left ear: Secondary | ICD-10-CM

## 2018-11-07 ENCOUNTER — Other Ambulatory Visit: Payer: Self-pay | Admitting: Family Medicine

## 2018-11-07 ENCOUNTER — Other Ambulatory Visit: Payer: Self-pay | Admitting: Obstetrics and Gynecology

## 2018-11-07 DIAGNOSIS — Z1231 Encounter for screening mammogram for malignant neoplasm of breast: Secondary | ICD-10-CM

## 2019-01-25 ENCOUNTER — Ambulatory Visit: Payer: BLUE CROSS/BLUE SHIELD

## 2019-03-30 DIAGNOSIS — S92919A Unspecified fracture of unspecified toe(s), initial encounter for closed fracture: Secondary | ICD-10-CM | POA: Insufficient documentation

## 2019-05-21 ENCOUNTER — Other Ambulatory Visit: Payer: Self-pay

## 2019-05-21 ENCOUNTER — Encounter: Payer: Self-pay | Admitting: Family Medicine

## 2019-05-21 ENCOUNTER — Ambulatory Visit (INDEPENDENT_AMBULATORY_CARE_PROVIDER_SITE_OTHER): Payer: BC Managed Care – PPO

## 2019-05-21 DIAGNOSIS — Z23 Encounter for immunization: Secondary | ICD-10-CM

## 2019-05-24 ENCOUNTER — Ambulatory Visit: Payer: BLUE CROSS/BLUE SHIELD

## 2019-06-11 ENCOUNTER — Ambulatory Visit
Admission: RE | Admit: 2019-06-11 | Discharge: 2019-06-11 | Disposition: A | Discharge status: Home / Self Care | Payer: BC Managed Care – PPO | Source: Ambulatory Visit | Attending: Obstetrics and Gynecology | Admitting: Obstetrics and Gynecology

## 2019-06-11 ENCOUNTER — Other Ambulatory Visit: Payer: Self-pay

## 2019-06-11 DIAGNOSIS — Z1231 Encounter for screening mammogram for malignant neoplasm of breast: Secondary | ICD-10-CM

## 2019-11-03 ENCOUNTER — Encounter: Payer: Self-pay | Admitting: Family Medicine

## 2019-12-13 ENCOUNTER — Encounter: Payer: Self-pay | Admitting: Family Medicine

## 2020-02-06 ENCOUNTER — Encounter: Payer: Self-pay | Admitting: Family Medicine

## 2020-02-18 ENCOUNTER — Ambulatory Visit (INDEPENDENT_AMBULATORY_CARE_PROVIDER_SITE_OTHER): Payer: BC Managed Care – PPO

## 2020-02-18 ENCOUNTER — Other Ambulatory Visit: Payer: Self-pay

## 2020-02-18 DIAGNOSIS — Z23 Encounter for immunization: Secondary | ICD-10-CM

## 2020-02-18 NOTE — Progress Notes (Signed)
Per orders of Dr. Durene Cal, injection of Tdap given by Yolanda Manges in left deltoid. Patient tolerated injection well.

## 2020-03-10 LAB — HM DEXA SCAN

## 2020-04-09 LAB — HEPATIC FUNCTION PANEL
ALT: 15 (ref 7–35)
AST: 26 (ref 13–35)
Alkaline Phosphatase: 91 (ref 25–125)
Bilirubin, Total: 0.5

## 2020-04-09 LAB — BASIC METABOLIC PANEL
BUN: 11 (ref 4–21)
CO2: 23 — AB (ref 13–22)
Chloride: 100 (ref 99–108)
Creatinine: 0.9 (ref 0.5–1.1)
Glucose: 104
Potassium: 4.4 (ref 3.4–5.3)
Sodium: 136 — AB (ref 137–147)

## 2020-04-09 LAB — COMPREHENSIVE METABOLIC PANEL
Albumin: 4.5 (ref 3.5–5.0)
Calcium: 9.5 (ref 8.7–10.7)
GFR calc Af Amer: 80
GFR calc non Af Amer: 70

## 2020-04-09 LAB — CBC AND DIFFERENTIAL
HCT: 42 (ref 36–46)
Hemoglobin: 13.4 (ref 12.0–16.0)
Neutrophils Absolute: 3.6
Platelets: 298 (ref 150–399)
WBC: 6.6

## 2020-04-09 LAB — LIPID PANEL
Cholesterol: 216 — AB (ref 0–200)
HDL: 89 — AB (ref 35–70)
LDL Cholesterol: 15
LDl/HDL Ratio: 1.3
Triglycerides: 88 (ref 40–160)

## 2020-04-09 LAB — TSH: TSH: 3.41 (ref 0.41–5.90)

## 2020-04-09 LAB — CBC: RBC: 4.5 (ref 3.87–5.11)

## 2020-04-09 LAB — HEMOGLOBIN A1C: Hemoglobin A1C: 5.7

## 2020-05-03 ENCOUNTER — Encounter: Payer: Self-pay | Admitting: Family Medicine

## 2020-05-12 ENCOUNTER — Other Ambulatory Visit: Payer: Self-pay

## 2020-05-12 ENCOUNTER — Ambulatory Visit (INDEPENDENT_AMBULATORY_CARE_PROVIDER_SITE_OTHER): Payer: BC Managed Care – PPO

## 2020-05-12 ENCOUNTER — Encounter: Payer: Self-pay | Admitting: Family Medicine

## 2020-05-12 DIAGNOSIS — Z23 Encounter for immunization: Secondary | ICD-10-CM

## 2020-06-10 ENCOUNTER — Other Ambulatory Visit: Payer: Self-pay | Admitting: Obstetrics and Gynecology

## 2020-06-10 DIAGNOSIS — Z1231 Encounter for screening mammogram for malignant neoplasm of breast: Secondary | ICD-10-CM

## 2020-07-10 ENCOUNTER — Encounter: Payer: Self-pay | Admitting: Family Medicine

## 2020-07-10 ENCOUNTER — Other Ambulatory Visit: Payer: Self-pay

## 2020-07-10 ENCOUNTER — Ambulatory Visit
Admission: RE | Admit: 2020-07-10 | Discharge: 2020-07-10 | Disposition: A | Discharge status: Home / Self Care | Payer: BC Managed Care – PPO | Source: Ambulatory Visit | Attending: Obstetrics and Gynecology | Admitting: Obstetrics and Gynecology

## 2020-07-10 DIAGNOSIS — Z1231 Encounter for screening mammogram for malignant neoplasm of breast: Secondary | ICD-10-CM

## 2020-07-13 ENCOUNTER — Other Ambulatory Visit: Payer: Self-pay

## 2020-07-13 DIAGNOSIS — M810 Age-related osteoporosis without current pathological fracture: Secondary | ICD-10-CM

## 2020-07-13 MED ORDER — ZOLEDRONIC ACID 5 MG/100ML IV SOLN: 5.0000 mg | Freq: Once | INTRAVENOUS | Status: AC

## 2020-09-05 ENCOUNTER — Encounter: Payer: Self-pay | Admitting: Family Medicine

## 2020-10-25 ENCOUNTER — Other Ambulatory Visit: Payer: BC Managed Care – PPO

## 2020-10-26 ENCOUNTER — Other Ambulatory Visit: Payer: BC Managed Care – PPO

## 2020-10-27 ENCOUNTER — Other Ambulatory Visit: Payer: BC Managed Care – PPO

## 2020-11-16 ENCOUNTER — Encounter: Payer: BC Managed Care – PPO | Admitting: Family Medicine

## 2020-12-14 ENCOUNTER — Telehealth (INDEPENDENT_AMBULATORY_CARE_PROVIDER_SITE_OTHER): Payer: BC Managed Care – PPO | Admitting: Family Medicine

## 2020-12-14 ENCOUNTER — Telehealth: Payer: Self-pay

## 2020-12-14 ENCOUNTER — Other Ambulatory Visit: Payer: Self-pay

## 2020-12-14 ENCOUNTER — Other Ambulatory Visit: Payer: Self-pay | Admitting: Family Medicine

## 2020-12-14 ENCOUNTER — Encounter: Payer: Self-pay | Admitting: Family Medicine

## 2020-12-14 DIAGNOSIS — J309 Allergic rhinitis, unspecified: Secondary | ICD-10-CM

## 2020-12-14 DIAGNOSIS — R059 Cough, unspecified: Secondary | ICD-10-CM | POA: Diagnosis not present

## 2020-12-14 MED ORDER — AZELASTINE HCL 0.1 % NA SOLN
2.0000 | Freq: Two times a day (BID) | NASAL | 12 refills | Status: DC
Start: 2020-12-14 — End: 2021-04-09

## 2020-12-14 MED ORDER — AZELASTINE HCL 0.1 % NA SOLN: 2.0000 | Freq: Two times a day (BID) | NASAL | 12 refills | Status: DC

## 2020-12-14 MED ORDER — AZITHROMYCIN 250 MG PO TABS: ORAL_TABLET | ORAL | 0 refills | Status: DC

## 2020-12-14 NOTE — Telephone Encounter (Signed)
Pt states that [facility] [address] has the medication needed.

## 2020-12-14 NOTE — Telephone Encounter (Signed)
Please see message from the pharmacy

## 2020-12-14 NOTE — Progress Notes (Signed)
   Monique Moore is a 65 y.o. female who presents today for a virtual office visit.  Assessment/Plan:  New/Acute Problems: Cough No red flags.  Likely allergic rhinitis flare.  She is on Zyrtec and Flonase.  We will add on Astelin nasal spray.  We will also send in pocket description of azithromycin with instruction to not start unless symptoms worsen or do not improve the next few days.  Discussed reasons to return to care. Follow up as needed.   Chronic Problems Addressed Today: Allergic Rhinitis Recent flare due to pollen outbreak.  Likely the source of her cough.  We will add on Astelin as above.    Subjective:  HPI:  Symptoms started about a week ago.  Has a history of allergies. Cough has not been productive. Feels like she has a sinus headache. Has been taking her zyrtec and flonase. No fevers or chills. No shortness of breath. No chest pain. Feels very congested.        Objective/Observations  Physical Exam: Gen: NAD, resting comfortably Pulm: Normal work of breathing Neuro: Grossly normal, moves all extremities Psych: Normal affect and thought content  Virtual Visit via Video   I connected with Monique Moore on 12/14/20 at  4:00 PM EDT by a video enabled telemedicine application and verified that I am speaking with the correct person using two identifiers. The limitations of evaluation and management by telemedicine and the availability of in person appointments were discussed. The patient expressed understanding and agreed to proceed.   Patient location: Home Provider location: [place] [employer] Persons participating in the virtual visit: Myself and Patient     Katina Degree. Jimmey Ralph, MD 12/14/2020 9:21 AM

## 2020-12-14 NOTE — Telephone Encounter (Signed)
azelastine (ASTELIN) 0.1 % nasal spray   Pt's pharmacy does not have medication above. She is willing to try another pharmacy or a subsitute

## 2020-12-14 NOTE — Telephone Encounter (Signed)
Rx sent in

## 2020-12-16 ENCOUNTER — Other Ambulatory Visit: Payer: Self-pay

## 2020-12-16 ENCOUNTER — Encounter: Payer: Self-pay | Admitting: Family Medicine

## 2020-12-16 DIAGNOSIS — R059 Cough, unspecified: Secondary | ICD-10-CM

## 2020-12-16 MED ORDER — BENZONATATE 200 MG PO CAPS: 200.0000 mg | ORAL_CAPSULE | Freq: Two times a day (BID) | ORAL | 0 refills | Status: DC | PRN

## 2021-01-01 NOTE — Progress Notes (Signed)
Phone [phone] Virtual visit via Video note   Subjective:  Chief complaint: Chief Complaint  Patient presents with  . Covid Positive    Fever, cough, post nasal drip. Tested positive yesterday. First day of symptoms was Saturday.     This visit type was conducted due to national recommendations for restrictions regarding the COVID-19 Pandemic (e.g. social distancing).  This format is felt to be most appropriate for this patient at this time balancing risks to patient and risks to population by having him in for in person visit.  No physical exam was performed (except for noted visual exam or audio findings with Telehealth visits).    Our team/I connected with Monique Moore at  1:40 PM EDT by a video enabled telemedicine application (doxy.me or caregility through epic) and verified that I am speaking with the correct person using two identifiers.  Location patient: [place] Location provider: [facility], office Persons participating in the virtual visit:  patient  Our team/I discussed the limitations of evaluation and management by telemedicine and the availability of in person appointments. In light of current covid-19 pandemic, patient also understands that we are trying to protect them by minimizing in office contact if at all possible.  The patient expressed consent for telemedicine visit and agreed to proceed. Patient understands insurance will be billed.   Past Medical History-  Patient Active Problem List   Diagnosis Date Noted  . Hyperglycemia 08/12/2014    Priority: Medium  . Thumb swelling 07/12/2014    Priority: Medium  . Osteoporosis 08/12/2014    Priority: Low  . Allergic rhinitis 08/12/2014    Priority: Low  . MRI >1.5 T contraindicated due to metal implant 07/16/2014    Priority: Low  . History of total hysterectomy 05/03/2018  . Leg length discrepancy 12/07/2017    Medications- reviewed and updated Current Outpatient Medications  Medication Sig Dispense  Refill  . cetirizine (ZYRTEC) 10 MG tablet Take 1 tablet (10 mg total) by mouth daily. 90 tablet 3  . Cyanocobalamin (VITAMIN B12 PO) Take by mouth.    . fluticasone (FLONASE) 50 MCG/ACT nasal spray Place into both nostrils daily.    . Multiple Vitamin (MULTIVITAMIN) tablet Take 1 tablet by mouth daily.    . Multiple Vitamin (MULTIVITAMIN) tablet Take 1 tablet by mouth daily.    . nirmatrelvir/ritonavir EUA (PAXLOVID) TABS Take 3 tablets by mouth 2 (two) times daily for 5 days. Patient GFR is 72. Take nirmatrelvir (150 mg) 2 tablet(s) twice daily for 5 days and ritonavir (100 mg) one tablet twice daily for 5 days. 30 tablet 0  . VITAMIN D PO Take by mouth.    Marland Kitchen azelastine (ASTELIN) 0.1 % nasal spray Place 2 sprays into both nostrils 2 (two) times daily. 30 mL 12  . benzonatate (TESSALON) 200 MG capsule Take 1 capsule (200 mg total) by mouth 2 (two) times daily as needed for cough. 20 capsule 0   Current Facility-Administered Medications  Medication Dose Route Frequency Provider Last Rate Last Admin  . zoledronic acid (RECLAST) injection 5 mg  5 mg Intravenous Once Shelva Majestic, MD         Objective:  Pulse 78   Temp 99 F (37.2 C)  self reported vitals Gen: appears fatigued, nasal sounding voice Lungs: nonlabored, normal respiratory rate  Skin: appears dry, no obvious rash     Assessment and Plan   Positive Covid S:first day of symptoms were on Saturday. Was out of town last week.  Yesterday had fever and had positive home test. Temperature 99 without ibuprofen earlier today. Has fatigue, cough, post nasal drip. No shortness of breath. Has been able to do some work at desk still.   Patient had sinus infection several weeks ago  Which has heled up A/P: Patient with testing confirming covid 19 with first day of covid 19 symptoms may 7th Vaccination status:vaccinated x3- last vaccine october   Therefore: - recommended patient watch closely for shortness of breath or confusion or  worsening symptoms and if those occur patient should contact us immediately or seek care in the emergency department -recommended patient consider purchasing pulse oximeter and if levels 94% or below persistently- seek care at the hospital - Patient needs to self isolate  for at least 10 days since first symptom AND at least 24 hours fever free without fever reducing medications AND have improvement in respiratory symptoms  - earliest possible day out of self isolation may 13th (as long as wearing mask additional 5 days), recommendations for patient -Patient should inform close contacts about exposure (anyone patient been around unmasked for more than 15 minutes) . Already informed her daughter who is positive.  - High risk for complications based on age->50 and 2 days away rom 57,  we discussed outpatient therapeutic options and patient opted to try paxlovid- no clear interactions - can ue tessalon or astelin as needed as previously rxed through Dr. Jimmey Ralph  Recommended follow up:  Future Appointments  Date Time Provider Department Center  04/09/2021  3:40 PM Shelva Majestic, MD [facility]    Lab/Order associations:   ICD-10-CM   1. COVID-19  U07.1     Meds ordered this encounter  Medications  . nirmatrelvir/ritonavir EUA (PAXLOVID) TABS    Sig: Take 3 tablets by mouth 2 (two) times daily for 5 days. Patient GFR is 72. Take nirmatrelvir (150 mg) 2 tablet(s) twice daily for 5 days and ritonavir (100 mg) one tablet twice daily for 5 days.    Dispense:  30 tablet    Refill:  0   Return precautions advised.  Tana Conch, MD

## 2021-01-01 NOTE — Patient Instructions (Addendum)
Online visit only

## 2021-01-04 ENCOUNTER — Telehealth (INDEPENDENT_AMBULATORY_CARE_PROVIDER_SITE_OTHER): Payer: Medicare Other | Admitting: Family Medicine

## 2021-01-04 ENCOUNTER — Encounter: Payer: Self-pay | Admitting: Family Medicine

## 2021-01-04 VITALS — HR 78 | Temp 99.0°F

## 2021-01-04 DIAGNOSIS — R739 Hyperglycemia, unspecified: Secondary | ICD-10-CM

## 2021-01-04 DIAGNOSIS — Z0000 Encounter for general adult medical examination without abnormal findings: Secondary | ICD-10-CM

## 2021-01-04 DIAGNOSIS — U071 COVID-19: Secondary | ICD-10-CM | POA: Diagnosis not present

## 2021-01-04 MED ORDER — NIRMATRELVIR/RITONAVIR (PAXLOVID)TABLET: 3.0000 | ORAL_TABLET | Freq: Two times a day (BID) | ORAL | 0 refills | Status: AC

## 2021-01-07 ENCOUNTER — Encounter: Payer: Self-pay | Admitting: Family Medicine

## 2021-01-13 ENCOUNTER — Encounter: Payer: Self-pay | Admitting: Family Medicine

## 2021-01-19 ENCOUNTER — Encounter: Payer: Self-pay | Admitting: Family Medicine

## 2021-01-29 ENCOUNTER — Encounter: Payer: Self-pay | Admitting: Family Medicine

## 2021-03-17 ENCOUNTER — Telehealth: Payer: Medicare Other | Admitting: Family

## 2021-03-17 DIAGNOSIS — R399 Unspecified symptoms and signs involving the genitourinary system: Secondary | ICD-10-CM | POA: Diagnosis not present

## 2021-03-17 MED ORDER — CEPHALEXIN 500 MG PO CAPS: 500.0000 mg | ORAL_CAPSULE | Freq: Two times a day (BID) | ORAL | 0 refills | Status: DC

## 2021-03-17 NOTE — Progress Notes (Signed)

## 2021-04-05 ENCOUNTER — Encounter: Payer: Self-pay | Admitting: Family Medicine

## 2021-04-09 ENCOUNTER — Other Ambulatory Visit: Payer: Self-pay

## 2021-04-09 ENCOUNTER — Encounter: Payer: Self-pay | Admitting: Family Medicine

## 2021-04-09 ENCOUNTER — Ambulatory Visit (INDEPENDENT_AMBULATORY_CARE_PROVIDER_SITE_OTHER): Payer: Medicare Other | Admitting: Family Medicine

## 2021-04-09 VITALS — BP 107/67 | HR 71 | Temp 98.2°F | Ht 63.0 in | Wt 116.6 lb

## 2021-04-09 DIAGNOSIS — Z23 Encounter for immunization: Secondary | ICD-10-CM | POA: Diagnosis not present

## 2021-04-09 DIAGNOSIS — R739 Hyperglycemia, unspecified: Secondary | ICD-10-CM

## 2021-04-09 DIAGNOSIS — E785 Hyperlipidemia, unspecified: Secondary | ICD-10-CM | POA: Diagnosis not present

## 2021-04-09 DIAGNOSIS — R7303 Prediabetes: Secondary | ICD-10-CM

## 2021-04-09 DIAGNOSIS — M810 Age-related osteoporosis without current pathological fracture: Secondary | ICD-10-CM

## 2021-04-09 DIAGNOSIS — Z0000 Encounter for general adult medical examination without abnormal findings: Secondary | ICD-10-CM

## 2021-04-09 LAB — POCT GLYCOSYLATED HEMOGLOBIN (HGB A1C): Hemoglobin A1C: 5.6 % (ref 4.0–5.6)

## 2021-04-09 NOTE — Patient Instructions (Addendum)
Health Maintenance Due  Topic Date Due   PNA vac Low Risk Adult (1 of 2 - PCV13)   -PREVNAR 20 today before you leave Never done   INFLUENZA VACCINE Not available in office YET.   -Please consider getting your flu shot in the Fall. If you get this outside of our office, please let us know.  03/29/2021   Great job on exercising everyday and your diet! Keep up the good work.  1-3 mg Melatonin for rough sleep.  Agreed for EKG to be done today. Team order under mild hyperlipidemia  Also order a1c POC under hyperglycemia/prediabetes and complete before she goes  Recommended follow up: Return in about 1 year (around 04/09/2022) for follow up- or sooner if needed. We typically do annual wellness visits with our nurse specialist and then Dr. Durene Cal sees you for regular health discussions

## 2021-04-09 NOTE — Progress Notes (Signed)
Phone: [phone]   Subjective:  Patient presents today for their Welcome to Medicare Exam    Preventive Screening-Counseling & Management  Vision screen: normal Vision Screening   Right eye Left eye Both eyes  Without correction 20/20 20/25 20/20   With correction      Advanced directives: husband HCPOA, full code- would want withdrawn care if 3 doctors stated unlikely to recover  Modifiable Risk Factors/behavioral risk assessment/psychosocial risk assessment Regular exercise: daily and gets weight bearing and some weights every other day- unless she has skied Diet: very healthy  Wt Readings from Last 3 Encounters:  04/09/21 116 lb 9.6 oz (52.9 kg)  05/03/18 116 lb 9.6 oz (52.9 kg)  12/07/17 113 lb (51.3 kg)  Smoking Status: Never Smoker Second Hand Smoking status: No smokers in home Alcohol intake: 7 per week Other substance abuse/illicit drugs: no  Cardiac risk factors:  advanced age (older than 37 for men, 27 for women)  Very mild Hyperlipidemia - ascvd risk under 4% no Hypertension  No diabetes. Monitored for prediabetes Lab Results  Component Value Date   HGBA1C 5.6 04/09/2021  Family History: no family history of ischemic heart disease or stroke   Depression Screen/risk evaluation Risk factors: none. PHQ2 0  Depression screen [facility] 2/9 04/09/2021 01/04/2021 05/03/2018  Decreased Interest 0 0 0  Down, Depressed, Hopeless 0 0 0  PHQ - 2 Score 0 0 0  Altered sleeping - 0 -  Tired, decreased energy - 0 -  Change in appetite - 0 -  Feeling bad or failure about yourself  - 0 -  Trouble concentrating - 0 -  Moving slowly or fidgety/restless - 0 -  Suicidal thoughts - 0 -  PHQ-9 Score - 0 -  Difficult doing work/chores - Not difficult at all -    Functional ability and level of safety Mobility assessment:  timed get up and go <12 seconds Activities of Daily Living- Independent in ADLs (toileting, bathing, dressing, transferring, eating) and in IADLs (shopping,  housekeeping, managing own medications, and handling finances) Home Safety: Loose rugs (no), smoke detectors (up to date), small pets (granddog but no regular pets in home), grab bars (doesn't have currently), stairs (no issues), life-alert system (would use cell phone or watch) Hearing Difficulties: -patient declines outside of baseline issue- 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 0 No  Number falls in past yr: 0 -  Injury with Fall? 0 -  Risk for fall due to : No Fall Risks -  Follow up Falls evaluation completed -   Opioid use history:  no long term opioids use Self assessment of health status: "very good"  Required Immunizations needed today:  prevnar 20 today, would recommend flu shot in the fall Immunization History  Administered Date(s) Administered   Influenza Split 06/10/2011, 05/23/2012   Influenza Whole 07/06/2007, 06/05/2008, 05/28/2009, 07/14/2010   Influenza,inj,Quad PF,6+ Mos 06/11/2013, 06/04/2014, 06/12/2015, 06/27/2016, 06/05/2017, 05/03/2018, 05/21/2019, 05/12/2020   Influenza-Unspecified 04/25/2019, 05/21/2019   Moderna Sars-Covid-2 Vaccination 11/06/2019, 12/13/2019, 06/21/2020, 02/04/2021   PNEUMOCOCCAL CONJUGATE-20 04/09/2021   Tdap 12/10/2009, 02/18/2020, 03/08/2020   Zoster Recombinat (Shingrix) 05/03/2018, 07/10/2018   Zoster, Live 07/10/2018   Health Maintenance  Topic Date Due   Pneumonia vaccines (1 of 2 - PCV13) 01/06/2021   Flu Shot  03/29/2021   Mammogram  07/10/2022   Colon Cancer Screening  06/19/2023   Tetanus Vaccine  03/08/2030  DEXA scan (bone density measurement)  Completed   COVID-19 Vaccine  Completed   Hepatitis C Screening: USPSTF Recommendation to screen - Ages 6218-79 yo.  Completed   Zoster (Shingles) Vaccine  Completed   HIV Screening  Addressed   HPV Vaccine  Aged Out    Screening tests-  Colon cancer screening- 06/18/18 with 5 year  repeat. Does get some rectal bleeding but bright red with hemorrhoids and wiping only (not in toilet) Lung Cancer screening- not a candidate for lung cancer screening Skin cancer screening- Dr. Leonie ManStinehelfer yearly 4. Cervical cancer screening- still sees Dr. Huntley Decomlin who also treats osteoporosis- technically at [id number] does not have to have further paps- no history of recent abnormal pap smear- 1986 needed cryotherapy 5. Breast cancer screening- exams with GYN and mammograms 07/10/20 most recently- at breast center  The following were reviewed and entered/updated in epic: Past Medical History:  Diagnosis Date   Endometriosis 2000   endometriosis related, pain with intercourse   IBS (irritable bowel syndrome)    ? improved with diet changes   Patient Active Problem List   Diagnosis Date Noted   Hyperglycemia 08/12/2014    Priority: Medium   Thumb swelling 07/12/2014    Priority: Medium   Osteoporosis 08/12/2014    Priority: Low   Allergic rhinitis 08/12/2014    Priority: Low   MRI >1.5 T contraindicated due to metal implant 07/16/2014    Priority: Low   History of total hysterectomy 05/03/2018   Leg length discrepancy 12/07/2017   Past Surgical History:  Procedure Laterality Date   ABDOMINAL HYSTERECTOMY  2003   benign, cervix included   FOOT SURGERY     Dr. Lestine BoxBednarz [facility] [facility]   HEMORRHOID SURGERY     growth on hemorrhoid-benign   LAPAROTOMY  11/00   eval endometriosis   OOPHORECTOMY     ORIF ELBOW FRACTURE  2011   performed in [place], hiking   STAPEDECTOMY  2003   otosclerosis L    Family History  Problem Relation Age of Onset   Atrial fibrillation Mother    Hypertension Mother    Arthritis Mother    Osteoporosis Mother    Cerebral aneurysm Father        hemorrhage   Diabetes Father    Hypertension Brother    Diabetes Brother     Medications- reviewed and updated Current Outpatient Medications  Medication Sig Dispense Refill   cetirizine (ZYRTEC) 10 MG tablet  Take 1 tablet (10 mg total) by mouth daily. 90 tablet 3   Cyanocobalamin (VITAMIN B12 PO) Take by mouth.     fluticasone (FLONASE) 50 MCG/ACT nasal spray Place into both nostrils daily.     Multiple Vitamin (MULTIVITAMIN) tablet Take 1 tablet by mouth daily.     VITAMIN D PO Take by mouth.     Current Facility-Administered Medications  Medication Dose Route Frequency Provider Last Rate Last Admin   zoledronic acid (RECLAST) injection 5 mg  5 mg Intravenous Once Shelva MajesticHunter, Jamarkis Branam O, MD        Allergies-reviewed and updated Allergies  Allergen Reactions   Codeine Nausea And Vomiting    Social History   Socioeconomic History   Marital status: Married    Spouse name: Not on file   Number of children: Not on file   Years of education: Not on file   Highest education level: Not on file  Occupational History   Not on file  Tobacco Use   Smoking status: Never  Smokeless tobacco: Never  Substance and Sexual Activity   Alcohol use: Yes    Comment: with dinner   Drug use: No   Sexual activity: Not on file  Other Topics Concern   Not on file  Social History Narrative   Married 1979. 2 children (son '85 and daughter '88). 1 grandson '15.       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       Hobbies: paint, water ski, travel   Social Determinants of Health   Financial Resource Strain: No  Food Insecurity: No  Transportation Needs: None  Physical Activity: excellent- exercises daily   Stress: None  Social Connections: patient has no concerns    Objective  Objective:  BP 107/67   Pulse 71   Temp 98.2 F (36.8 C) (Temporal)   Ht 5\' 3"  (1.6 m)   Wt 116 lb 9.6 oz (52.9 kg)   SpO2 97%   BMI 20.65 kg/m  Gen: NAD, resting comfortably HEENT: Mucous membranes are moist. Oropharynx normal Neck: no thyromegaly CV: RRR no murmurs rubs or gallops Lungs: CTAB no crackles, wheeze, rhonchi Abdomen: soft/nontender/nondistended/normal bowel sounds. No  rebound or guarding.  Ext: no edema Skin: warm, dry Neuro: grossly normal, moves all extremities, PERRLA  EKG: sinus bradycardia rhythm with rate 58, normal axis, normal intervals, no hypertrophy, no st or t wave changes    Assessment and Plan:   Welcome to Medicare exam completed-  Educated, counseled and referred based on above elements Educated, counseled and referred as appropriate for preventative needs Discussed and documented a written plan for preventiative services and screenings with personalized health advice- After Visit Summary was given to patient which included this plan  4. EKG offered - patient opted in    Status of chronic or acute concerns   # COVID in 12/2020 Did ok with covid. Husband got it later in week and did ok- sees Dr. 01/2021. Back to her normal routine   #Some dryness in scalp- whitish discoloration without red base  - Possible excoriated seborrheic keratosis in past  - better lately -no recent treatments   #Low back pain at times - - working with Caryl Never and that is helpful- particularly in water ski season   #LDL above 100 last year- tsh has been normal. Ascvd risk only 3.4% so not candidate for statin unless has had significant increase which I doubt. Wants to do full bloodwork closer to reclast in case needed for that  #Osteoporosis -Reclast  started through Dr. [employer] (apparently scores were worse despite oral medications)- no side effects on reclast - gets calcium 1200mg  per day through diet, vitamin D at least 800 units -doing weight bearing exercise - has upcoming repeat in november    #Hyperglycemia 5.7 recently with GYN also with history of lows- seen Dr. for help to balance this- was told healthy diet. A1c technically lower but our POC machine tends to run slightly low so suspect up to 6.1 but still overall stable- continue healthy lifestyle efforts Lab Results  Component Value Date   HGBA1C 5.6 04/09/2021    HGBA1C 5.7 04/09/2020   HGBA1C 6.1 05/03/2018   Recommended follow up: Return in about 1 year (around 04/09/2022) for follow up- or sooner if needed.  Lab/Order associations:   ICD-10-CM   1. Preventative health care  Z00.00     2. Hyperglycemia  R73.9 POCT A1C    3. Age-related osteoporosis without current pathological fracture  M81.0  4. Mild hyperlipidemia  E78.5 EKG 12-Lead    5. Prediabetes  R73.03 POCT A1C    6. Immunization due  Z23 Pneumococcal conjugate vaccine 20-valent (Prevnar 20)     I,Jada Bradford,acting as a scribe for Tana Conch, MD.,have documented all relevant documentation on the behalf of Tana Conch, MD,as directed by  Tana Conch, MD while in the presence of Tana Conch, MD.  I, Tana Conch, MD, have reviewed all documentation for this visit. The documentation on 04/09/21 for the exam, diagnosis, procedures, and orders are all accurate and complete.  Return precautions advised.  Tana Conch, MD

## 2021-04-16 ENCOUNTER — Encounter: Payer: Self-pay | Admitting: Family Medicine

## 2021-05-13 ENCOUNTER — Encounter: Payer: Self-pay | Admitting: Family Medicine

## 2021-05-28 ENCOUNTER — Other Ambulatory Visit: Payer: Self-pay | Admitting: Obstetrics and Gynecology

## 2021-05-28 DIAGNOSIS — Z1231 Encounter for screening mammogram for malignant neoplasm of breast: Secondary | ICD-10-CM

## 2021-06-11 LAB — BASIC METABOLIC PANEL
BUN: 14 (ref 4–21)
Chloride: 99 (ref 99–108)
Creatinine: 0.9 (ref 0.5–1.1)
Glucose: 88
Potassium: 4.6 (ref 3.4–5.3)
Sodium: 136 — AB (ref 137–147)

## 2021-06-11 LAB — CBC AND DIFFERENTIAL
HCT: 41 (ref 36–46)
Hemoglobin: 13.6 (ref 12.0–16.0)
Neutrophils Absolute: 3
WBC: 6

## 2021-06-11 LAB — LIPID PANEL
Cholesterol: 230 — AB (ref 0–200)
HDL: 103 — AB (ref 35–70)
LDL Cholesterol: 113
LDl/HDL Ratio: 1.1

## 2021-06-11 LAB — HEMOGLOBIN A1C: Hemoglobin A1C: 5.9

## 2021-06-11 LAB — COMPREHENSIVE METABOLIC PANEL
Albumin: 4.6 (ref 3.5–5.0)
Calcium: 9.3 (ref 8.7–10.7)
Globulin: 2

## 2021-06-11 LAB — CBC: RBC: 4.5 (ref 3.87–5.11)

## 2021-06-11 LAB — HEPATIC FUNCTION PANEL
ALT: 16 (ref 7–35)
AST: 26 (ref 13–35)
Alkaline Phosphatase: 75 (ref 25–125)

## 2021-06-12 ENCOUNTER — Encounter: Payer: Self-pay | Admitting: Family Medicine

## 2021-06-14 NOTE — Telephone Encounter (Signed)
Labs have been abstracted ?

## 2021-07-13 ENCOUNTER — Other Ambulatory Visit: Payer: Self-pay

## 2021-07-13 ENCOUNTER — Ambulatory Visit
Admission: RE | Admit: 2021-07-13 | Discharge: 2021-07-13 | Disposition: A | Discharge status: Home / Self Care | Payer: Medicare Other | Source: Ambulatory Visit | Attending: Obstetrics and Gynecology | Admitting: Obstetrics and Gynecology

## 2021-07-13 DIAGNOSIS — Z1231 Encounter for screening mammogram for malignant neoplasm of breast: Secondary | ICD-10-CM

## 2021-07-14 ENCOUNTER — Encounter: Payer: Self-pay | Admitting: Family Medicine

## 2021-07-15 ENCOUNTER — Other Ambulatory Visit: Payer: Self-pay

## 2021-07-15 NOTE — Telephone Encounter (Signed)
Reclast ijection added to the med list.

## 2021-07-27 ENCOUNTER — Encounter: Payer: Self-pay | Admitting: Family Medicine

## 2021-10-14 NOTE — Progress Notes (Signed)
 Formatting of this note is different from the original.  HPI    Physical Exam - General Health - well - no complaints, well - minor complaints, sleeping well and energy good    Nutrition - balanced diet    Exercise - Daily    Hours of sleep - 8    Pap smear -gyn/completed  Monthly Self Breast Exam - no problems  Subjective   Kathleen Newton is a 66 year old female is here for annual physical exam as well as f/u on chronic medical problems. Patient overall has been doing well. States stays physically active. Patient watches diet.   Patient with chronic back pain, continues to take tramadol 50mg  bid prn. No side effects.   States stopped taking zolpidem for insomnia, tries to use natural supplements.   Patient continues to take alprazolam 0.5mg  bid prn for anxiety, helps. No side effects.     Chief Complaint   Patient presents with   ? Medicare Wellness     Screening schedule given to pt     HPI     Medicare Wellness     Additional comments: Screening schedule given to pt        Last edited by Lorre Rosin on 10/14/2021 10:42 AM.       Patient Care Team:  Josefine Nice., MD as PCP - General (Internal Medicine)  Josefine Nice., MD as PCP - Raina Bunting MA Attributed Physician    Josefine Nice., MD as Attending Physician (Internal Medicine)  Specialists reviewed    Health Maintenance   Topic Date Due   ? Shingrix (1) Never done   ? COVID-19 Vaccine (4 - Booster for Janssen series) 01/26/2021   ? COLONOSCOPY 5 YEARS  06/16/2021   ? Mammogram Screening (Annual)  10/07/2021   ? Wellness 65 and over  10/15/2022   ? DTap,Tdap,and Td (2 - Td or Tdap) 05/21/2025   ? Hepatitis C Screening  Completed     Past Medical History:   Diagnosis Date   ? Anesthesia complication     severe anxiety attack   ? Bladder disorder    ? Depression    ? Endometriosis    ? IBS (irritable bowel syndrome) 01/17/2015   ? Irritable bowel syndrome     Irritable bowel syndrome   ? Myalgia and myositis     MYALGIA/MYOSITIS NOS (729.1)   ?  Neuropathy      Past Surgical History:   Procedure Laterality Date   ? ANKLE SURGERY      2009   ? BREAST BIOPSY      benign   ? COLONOSCOPY     ? HYSTERECTOMY     ? OTHER SURGICAL HISTORY      PARTIAL HYSTERECTOMY/ENDOMETRIOSIS GYN DR Darline Eis 1990S; 2012;   ? WRIST SURGERY       Family History   Problem Relation Age of Onset   ? Cancer Mother    ? Hypertension Mother    ? Stroke Mother    ? Colon cancer Mother    ? Cancer Father         prostate   ? Hypertension Father    ? Prostate cancer Father    ? Diabetes Brother    ? Other (Other) Other         MOTHER: CVA IN 50S, PARALIZED; COLON CA IN 70S; SKILLED NURSING 78S DECEASED 27-Apr-2024   ? Breast cancer Neg Hx    ? Ovarian cancer Neg  Hx      Social History     Socioeconomic History   ? Marital status: Married   Tobacco Use   ? Smoking status: Never   ? Smokeless tobacco: Never   Substance and Sexual Activity   ? Alcohol use: Yes     Alcohol/week: 2.0 standard drinks     Types: 2 Glasses of wine per week     Comment: occasionally   ? Drug use: No   ? Sexual activity: Yes     Partners: Male   Other Topics Concern   ? Appropriate use of safety belts in car? Yes     Immunization History   Administered Date(s) Administered   ? COVID-19 Vector-NR Vaccine rS-Ad26 (J[employer]J/Janssen) 11/05/2019   ? COVID-19 mRNA Vaccine [employer] EUA) Purple Cap 06/22/2020, 12/01/2020   ? Influenza Vaccine, Recombinant, Quadrivalent PF (AGE 1 YEARS AND OLDER) 07/09/2019, 06/11/2020   ? Pneumococcal Conjugate (PCV20) Prevnar 20 10/14/2021   ? Tdap (Tetanus, Diphtheria & Pertussis) 05/22/2015   ? Zoster Vaccination Live, SQ 05/27/2016     Hearing and Vision  Have you noticed any hearing difficulties?: No    Depression Questions (PHQ-2)  Over the past two weeks, have you felt down, depressed or hopeless: No  Over the past two weeks have you felt little interest or pleasure in doing things: No      Three Word Registration:  Choose 3 words from the list below and indicate how many the patient recalled:  Banana;Sunrise;Chair  How many words did the patient recall?: 3    Does your home have?  Rugs in the hallway: No  Grab bars in bathroom: No  Lack handrails on stairs: No  Poor lighting : No  Do you have smoke detectors in your home?: Yes  Do change the batteries? : Yes    Does patient need help with:  Phone: no  Transportation: no  Shopping: no  Preparing meals : no  Housework: no  Laundry: no  Medications: no  Managing money: no      Get Up and Go Initial Screening Questions  Have you fallen within the past 12 months?: No  Do you have difficulty with balance or walking?: No  Do you use any equipment or assistive device: No            Outpatient Medications Marked as Taking for the 10/14/21 encounter (Office Visit) with Kholodenko, Yana A., MD   Medication Sig   ? traMADol (ULTRAM) 50 MG TABS Take 1 tablet by mouth every 12 (twelve) hours as needed for Pain for up to 30 days.   ? ALPRAZolam (XANAX) 0.5 MG TABS TAKE 1 TABLET BY MOUTH TWO TIMES A DAY   ? [DISCONTINUED] zolpidem (AMBIEN) 10 MG tablet TAKE ONE TABLET BY MOUTH EVERY NIGHT AT BEDTIME AS NEEDED   ? dicyclomine (BENTYL) 10 MG CAPS Take 10 mg by mouth every 6 (six) hours as needed.   ? diclofenac DR (VOLTAREN) 50 MG TBEC Take 1 tablet by mouth 2 (two) times daily for 30 days. Indications: Joint Damage causing Pain and Loss of Function   ? cetirizine (ZYRTEC) 10 MG TABS Take 10 mg by mouth daily.     Allergies   Allergen Reactions   ? Ciprofloxacin Swelling   ? Penicillins Unknown   ? Sulfa Antibiotics Nausea     Throat swelling   ? Trazodone Headaches     migraine     HPI    Vitals:  10/14/21 1041   BP: 118/79   Pulse: 72   Temp: 98.2 F (36.8 C)   Weight: 185 lb (83.9 kg)   Height: 66 (167.6 cm)       Body mass index is 29.86 kg/m.    Objective   Objective:    Review of Systems   Constitutional: Negative for activity change, appetite change, chills, fatigue and fever.        No night sweats   HENT: Negative for ear pain, hearing loss, rhinorrhea, sore  throat and trouble swallowing.    Eyes: Negative for pain and visual disturbance.   Respiratory: Negative for cough, chest tightness and shortness of breath.    Cardiovascular: Negative for chest pain, palpitations and leg swelling.   Gastrointestinal: Negative for abdominal pain, blood in stool, constipation, diarrhea, nausea and vomiting.   Endocrine: Negative for cold intolerance and polydipsia.   Genitourinary: Negative for dysuria, frequency, hematuria and urgency.   Musculoskeletal: Positive for arthralgias, back pain and myalgias. Negative for gait problem.        No history of skeletal deformities or defects  No use of assitive devices for walking   Skin: Negative for color change and rash.   Neurological: Negative for dizziness, seizures, syncope, weakness, numbness and headaches.        No incoordination  No trouble walking   Hematological: Negative for adenopathy. Does not bruise/bleed easily.   Psychiatric/Behavioral: Negative for sleep disturbance ( improved) and suicidal ideas. The patient is nervous/anxious.      Physical Exam  Constitutional:       Appearance: Normal appearance. She is well-developed and well-nourished. She is not ill-appearing or sickly-appearing.      Comments: Normal Posture   HENT:      Head: Normocephalic and atraumatic.      Right Ear: Hearing, ear canal and external ear normal. No drainage. No foreign body. Tympanic membrane is not injected, erythematous, retracted or bulging.      Left Ear: Hearing, ear canal and external ear normal. No drainage. No foreign body. Tympanic membrane is not injected, erythematous, retracted or bulging.   Eyes:      Extraocular Movements: EOM normal.      Conjunctiva/sclera: Conjunctivae normal.      Right eye: Right conjunctiva is not injected.      Left eye: Left conjunctiva is not injected.      Pupils: Pupils are equal, round, and reactive to light.      Comments: Normal visual acuity  Normal sensation of trigeminal nerves  Symmetrical  functioning of facial nerves   Neck:      Thyroid: No thyroid mass or thyromegaly.      Vascular: No carotid bruit.      Trachea: Trachea normal. No tracheal deviation.   Cardiovascular:      Rate and Rhythm: Normal rate and regular rhythm.      Pulses: Intact distal pulses.      Heart sounds: Normal heart sounds, S1 normal and S2 normal.     No S3 or S4 sounds.      Comments: No pericardial friction rub  Pulmonary:      Effort: Pulmonary effort is normal. No accessory muscle usage or respiratory distress.      Breath sounds: Normal breath sounds. No wheezing, rhonchi or rales.   Chest:      Chest wall: No mass.   Abdominal:      General: Bowel sounds are normal.  Palpations: Abdomen is soft. There is no hepatosplenomegaly.      Tenderness: There is no abdominal tenderness.   Musculoskeletal:         General: No edema. Normal range of motion.      Cervical back: Normal range of motion and neck supple. No edema.   Lymphadenopathy:      Cervical: No cervical adenopathy.   Skin:     General: Skin is warm, dry and intact.      Coloration: Skin is not pale.      Nails: There is no clubbing or cyanosis.      Comments: No digital ulcers  No loss of hair   Neurological:      Mental Status: She is alert and oriented to person, place, and time.      Cranial Nerves: No cranial nerve deficit.      Sensory: No sensory deficit.      Motor: Motor strength is normal.      Coordination: Coordination normal.      Gait: Gait normal.      Deep Tendon Reflexes: Reflexes are normal and symmetric.      Comments: Normal gag reflex  Symmetrical functioning of hypoglossal nerve   Psychiatric:         Mood and Affect: Mood and affect normal.         Speech: Speech normal.         Behavior: Behavior normal.         Thought Content: Thought content normal.         Cognition and Memory: Cognition and memory normal. She does not exhibit impaired recent memory.         Judgment: Judgment normal.      Comments: Appropriate fund of knowledge and  aware of current events     Procedures    Chronic Disease Management    Assessment & Plan:    Encounter Diagnoses    ICD-10-CM ICD-9-CM    1. Routine general medical examination at a health care facility  Z00.00 V70.0      2. DDD (degenerative disc disease), lumbosacral  M51.37 722.52 traMADol (ULTRAM) 50 MG TABS     3. Anxiety state  F41.1 300.00      4. Mixed hyperlipidemia  E78.2 272.2 traMADol (ULTRAM) 50 MG TABS      CMP (BAMP,TP,ALB,TBIL,ALK,AST,ALT)      LIPID PANEL (CHOL, TRIG, HDL, LDL)      TSH     5. Pre-diabetes  R73.03 790.29 VENIPUNC,VENOUS BLOOD      CBC WITH DIFFERENTIAL      HEMOGLOBIN A1C      VENIPUNC,VENOUS BLOOD     6. Vitamin D deficiency  E55.9 268.9 VITAMIN D (25 OH)     7. Need for vaccination for pneumococcus  Z23 V03.82 PNEUMOCOCCAL CONJUGATE (PCV20) PREVNAR 20      IMMUNIZ, ADMIN, FIRST       Considerations      Doing well  Continue on tramadol 50mg  bid prn for chronic back pain  Continue on alprazolam bid prn for anxiety. Medication is effective/no side effects  Blood work today  Discussed dietary modifications/increase physical activity  Will f/u for colonoscopy  mammogram    End of life planning was discussed during this visit.    Goals & Recommendations:     Goals     ? Connect with social support system      Counselor, Psychologist, Family, Friends, Religious  [email or web address]       ?  Exercising Regularly      [email or web address]       ? Take Medication as discussed      http://[email or web address]  [email or web address]        Return in about 6 months (around 04/13/2022).  Electronically signed by Josefine Nice., MD at 10/17/2021  8:39 PM EST

## 2021-10-14 NOTE — Progress Notes (Signed)
 Formatting of this note is different from the original.  Subjective   Subjective:    Kathleen Newton is a 66 year old female who presents today with   Chief Complaint   Patient presents with   ? Medicare Wellness     Screening schedule given to pt     HPI     Medicare Wellness     Additional comments: Screening schedule given to pt        Last edited by Lorre Rosin on 10/14/2021 10:42 AM.       HPI    Patient presents for annual physical exam.  States has been walking daily; states has left lower buttocks pain radiating to her leg. No sharp pain. States had medical massage, helped temporarily.  States saw Dr Peggy Bowens pain management;   Patient has been taking medications as prescribed.   States off zolpidem now, doing ok.     Health Maintenance   Topic Date Due   ? Shingrix (1) Never done   ? Wellness 65 and over  Never done   ? COVID-19 Vaccine (4 - Booster for Janssen series) 01/26/2021   ? COLONOSCOPY 5 YEARS  06/16/2021   ? Mammogram Screening (Annual)  10/07/2021   ? DTap,Tdap,and Td (2 - Td or Tdap) 05/21/2025   ? Hepatitis C Screening  Completed     Past Medical History:   Diagnosis Date   ? Anesthesia complication     severe anxiety attack   ? Bladder disorder    ? Depression    ? Endometriosis    ? IBS (irritable bowel syndrome) 01/17/2015   ? Irritable bowel syndrome     Irritable bowel syndrome   ? Myalgia and myositis     MYALGIA/MYOSITIS NOS (729.1)   ? Neuropathy      Past Surgical History:   Procedure Laterality Date   ? ANKLE SURGERY      2009   ? BREAST BIOPSY      benign   ? COLONOSCOPY     ? HYSTERECTOMY     ? OTHER SURGICAL HISTORY      PARTIAL HYSTERECTOMY/ENDOMETRIOSIS GYN DR Darline Eis 1990S; 2012;   ? WRIST SURGERY         History   Alcohol Use   ? 2.0 standard drinks/week   ? 2 Glasses of wine per week     Comment: occasionally     Social History     Tobacco Use   Smoking Status Never   Smokeless Tobacco Never     Counseling given: Not Answered    Family History   Problem Relation Age of  Onset   ? Cancer Mother    ? Hypertension Mother    ? Stroke Mother    ? Colon cancer Mother    ? Cancer Father         prostate   ? Hypertension Father    ? Prostate cancer Father    ? Diabetes Brother    ? Other (Other) Other         MOTHER: CVA IN 50S, PARALIZED; COLON CA IN 70S; SKILLED NURSING 78S DECEASED 04/18/2024   ? Breast cancer Neg Hx    ? Ovarian cancer Neg Hx      Allergies   Allergen Reactions   ? Ciprofloxacin Swelling   ? Penicillins Unknown   ? Sulfa Antibiotics Nausea     Throat swelling   ? Trazodone Headaches     migraine  No outpatient medications have been marked as taking for the 10/14/21 encounter (Office Visit) with Josefine Nice., MD.     Review of Systems    Vitals:    10/14/21 1041   BP: 144/86   Pulse: 72   Temp: 98.2 F (36.8 C)   Weight: 185 lb (83.9 kg)   Height: 66 (167.6 cm)     Body mass index is 29.86 kg/m.         Objective   Objective:  Physical Exam    Procedures    Chronic Disease Management    Assessment & Plan:  There are no diagnoses linked to this encounter.    Considerations         Goals     ? Connect with social support system      Counselor, Psychologist, Family, Friends, Religious  [email or web address]       ? Exercising Regularly      [email or web address]       ? Take Medication as discussed      [email or web address]&[email or web address]  [email or web address]        No follow-ups on file.  Electronically signed by Josefine Nice., MD at 10/17/2021  8:39 PM EST

## 2021-10-25 ENCOUNTER — Ambulatory Visit: Admit: 2021-10-25 | Primary: Internal Medicine

## 2021-10-25 ENCOUNTER — Inpatient Hospital Stay: Payer: MEDICARE

## 2021-10-25 MED ORDER — LACTATED RINGERS IV SOLN
INTRAVENOUS | Status: DC
Start: 2021-10-25 — End: 2021-10-25
  Administered 2021-10-25: 14:00:00 via INTRAVENOUS

## 2021-10-25 MED ORDER — PROPOFOL 200 MG/20ML IV EMUL
200 MG/20ML | INTRAVENOUS | Status: DC | PRN
Start: 2021-10-25 — End: 2021-10-25
  Administered 2021-10-25 (×2): 30 via INTRAVENOUS
  Administered 2021-10-25: 16:00:00 40 via INTRAVENOUS
  Administered 2021-10-25: 16:00:00 20 via INTRAVENOUS
  Administered 2021-10-25 (×7): 30 via INTRAVENOUS

## 2021-10-25 MED ORDER — PROPOFOL 200 MG/20ML IV EMUL
200 MG/20ML | INTRAVENOUS | Status: AC
Start: 2021-10-25 — End: ?

## 2021-10-25 MED ORDER — LIDOCAINE (CARDIAC) 100 MG/5ML IV SOLN (MIXTURES ONLY)
100 MG/5ML | INTRAVENOUS | Status: AC
Start: 2021-10-25 — End: ?

## 2021-10-25 MED ORDER — LIDOCAINE (CARDIAC) 100 MG/5ML IV SOLN (MIXTURES ONLY)
100 MG/5ML | INTRAVENOUS | Status: DC | PRN
Start: 2021-10-25 — End: 2021-10-25
  Administered 2021-10-25: 16:00:00 40 via INTRAVENOUS
  Administered 2021-10-25: 16:00:00 60 via INTRAVENOUS

## 2021-10-25 MED FILL — DIPRIVAN 200 MG/20ML IV EMUL: 200 MG/20ML | INTRAVENOUS | Qty: 60

## 2021-10-25 MED FILL — LIDOCAINE HCL (CARDIAC) 100 MG/5ML IV SOSY: 100 MG/5ML | INTRAVENOUS | Qty: 5

## 2021-10-25 NOTE — Anesthesia Post-Procedure Evaluation (Signed)
Department of Anesthesiology  Postprocedure Note    Patient: Kathleen Newton  MRN: [id number]  Birthdate: [birth year]  Date of evaluation: 10/25/2021      Procedure Summary     Date: 10/25/21 Room / Location: [facility]    Anesthesia Start: 1026 Anesthesia Stop: 1053    Procedure: COLONOSCOPY POLYPECTOMY SNARE/COLD BIOPSY Diagnosis:       Family history of colon cancer      (Family history of colon cancer [Z80.0])    Surgeons: Hunt Oris, MD Responsible Provider: Rochel Brome, MD    Anesthesia Type: MAC ASA Status: 1          Anesthesia Type: No value filed.    Aldrete Phase I: Aldrete Score: 10    Aldrete Phase II:        Anesthesia Post Evaluation    Patient location during evaluation: PACU  Patient participation: complete - patient participated  Level of consciousness: awake and alert  Airway patency: patent  Nausea & Vomiting: no nausea and no vomiting  Complications: no  Cardiovascular status: hemodynamically stable  Respiratory status: acceptable  Hydration status: euvolemic  Multimodal analgesia pain management approach

## 2021-10-25 NOTE — Op Note (Signed)
Colonoscopy Procedure Note    Patient: Kathleen Newton MRN: [id number]     Date of Birth: September 04, 1955  Age: 66 y.o.  Sex: female    Unit: [facility] ENDOSCOPY Room/Bed: Endo Pool/NONE Location: [facility]       Colonoscopy with polypectomy (cold snare)    Admitting Physician: Gregary Cromer     Primary Care Physician: Jeanell Sparrow, MD      Preoperative Diagnosis: Family history of colon cancer [Z80.0]      DATE OF OPERATION: 10/25/2021    OPERATIVE SURGEON: Gregary Cromer, MD      ANESTHESIA: Monitor Anesthesia Care      Procedure Details:    After informed consent was obtained with all risks and benefits of procedure explained and preoperative exam completed, the patient was taken to the endoscopy suite and placed in the left lateral decubitus position. Upon sequential sedation as per above, a digital rectal exam was performed and was normal.  The Olympus videocolonoscope  was inserted in the rectum and carefully advanced to the cecum. Cecum Intubated Yes. The quality of preparation was good.  The colonoscope was slowly withdrawn with careful evaluation between folds. Retroflexion in the rectum was performed.      Estimated Blood Loss: minimal    Complications:  none    Findings:   diverticulosis,  Mild in degree, involving the sigmoid  hemorrhoids internal and external, Small in size  polyp(s) #1, 5 mm in size, located in the cecum removed by cold snare and retrieved for pathology  #2, 8 mm in size, located in the cecum removed by cold snare and retrieved for pathology  #3, 4 mm in size, located in the descending colon removed by cold snare and retrieved for pathology    Plan: Await pathology results for timing of next colonoscopy.        Signed By: Gregary Cromer, MD

## 2021-10-25 NOTE — Progress Notes (Signed)
Ambulatory Surgery/Procedure Discharge Note    Vitals:    10/25/21 1115   BP: (!) 145/89   Pulse: 81   Resp: 16   Temp:    SpO2: 98%     BP within 20% of pre-procedural level  In: 600 [P.O.:200; I.V.:400]  Out: -     Restroom use offered before discharge.  Yes    Pain assessment:  none  Pain Level: 0        Patient discharged to home/self care. Patient discharged via wheel chair by transporter to waiting family/S.O.       10/25/2021 12:51 PM

## 2021-10-25 NOTE — Discharge Instructions (Addendum)
ENDOSCOPY DISCHARGE INSTRUCTIONS:    Call the physician that did your procedure for any questions or concern:    [facility]: [phone]  DR. CARMEN MEIER          ACTIVITY:    There are potential side effects to the medications used for sedation and anesthesia during your procedure.  These include:  Dizziness or light-headedness, confusion or memory loss, delayed reaction times, loss of coordination, nausea and vomiting.  Because of your increased risk for injury, we ask that you observe the following precautions:  For the next 24 hours,  DO NOT operate an automobile, bicycle, motorcycle, lawn mower, power tools or large equipment of any kind.  Do not drink alcohol, sign any legal documents or make any legal decisions for 24 hours.  Do not bend your head over lower than your heart.  DO sit on the side of bed/couch awhile before getting up.  Plan on bedrest or quiet relaxation today.  You may resume normal activities in 24 hours.    DIET:    Your first meal today should be light, avoiding spicy and fatty foods.  If you tolerate this first meal, then you may advance to your regular diet unless otherwise advised by your physician.    NORMAL SYMPTOMS:  -Mild sore throat if you???ve had an EGD   -Gaseous discomfort    NOTIFY YOUR PHYSICIAN IF THESE SYMPTOMS OCCUR:  1. Fever (greater than 100)  5. Increased abdominal bloating  2. Severe pain    6. Excessive bleeding  3. Nausea and vomiting  7. Chest pain                                                                    4. Chills    8. Shortness of breath    ADDITIONAL INSTRUCTIONS:    Biopsy results: Call [facility] for biopsy results in 1 week    Educational Information:     Findings:   diverticulosis,  Mild in degree, involving the sigmoid  hemorrhoids internal and external, Small in size  polyp(s) #1, 5 mm in size, located in the cecum removed by cold snare and retrieved for pathology  #2, 8 mm in size, located in the cecum removed by cold snare and retrieved  for pathology  #3, 4 mm in size, located in the descending colon removed by cold snare and retrieved for pathology     Plan: Await pathology results for timing of next colonoscopy.         Please review these discharge instructions this evening or tomorrow for  information you may have forgotten.            We want to thank you for choosing the [facility] as your health care provider. We always strive to provide you with excellent care while you are here. You may receive a survey in the mail regarding your care. We would appreciate you taking a few minutes of your time to complete this survey.

## 2021-10-25 NOTE — Anesthesia Pre-Procedure Evaluation (Signed)
Department of Anesthesiology  Preprocedure Note       Name:  Kathleen Newton   Age:  66 y.o.  DOB:  1956-07-22                                          MRN:  [id number]         Date:  10/25/2021      Surgeon: Juliann Mule):  Hunt Oris, MD    Procedure: Procedure(s):  COLONOSCOPY    Medications prior to admission:   Prior to Admission medications    Medication Sig Start Date End Date Taking? Authorizing Provider   ALPRAZolam (XANAX) 0.5 MG tablet TAKE 1 TABLET BY MOUTH 2 TIMES A DAY 10/09/21   Historical Provider, MD   cetirizine (ZYRTEC) 10 MG tablet Take 10 mg by mouth daily    Historical Provider, MD   traMADol (ULTRAM) 50 MG tablet Take 1 tablet by mouth every 12 (twelve) hours as needed for Pain for up to 30 days. 10/14/21   Historical Provider, MD   dicyclomine (BENTYL) 20 MG tablet TAKE 1 TABLET BY MOUTH FOUR TIMES A DAY 08/26/21   Historical Provider, MD   traZODone (DESYREL) 50 MG tablet TAKE 1 TABLET BY MOUTH AT BEDTIME 08/23/21   Historical Provider, MD       Current medications:    Current Facility-Administered Medications   Medication Dose Route Frequency Provider Last Rate Last Admin   ??? lactated ringers IV soln infusion   IntraVENous Continuous Rochel Brome, MD           Allergies:    Allergies   Allergen Reactions   ??? Amitriptyline      anxiety   ??? Ciprofloxacin Swelling   ??? Penicillins Other (See Comments)     Angioedema     ??? Sulfa Antibiotics Nausea Only     Throat swelling  Throat swelling     ??? Trazodone Headaches     migraine       Problem List:  There is no problem list on file for this patient.      Past Medical History:  History reviewed. No pertinent past medical history.    Past Surgical History:        Procedure Laterality Date   ??? COLONOSCOPY     ??? PLANTAR FASCIA RELEASE     ??? WRIST SURGERY         Social History:    Social History     Tobacco Use   ??? Smoking status: Never   ??? Smokeless tobacco: Never   Substance Use Topics   ??? Alcohol use: Yes     Comment: 1-2 drinks weekly                                 Counseling given: Not Answered      Vital Signs (Current):   Vitals:    10/25/21 0848   BP: (!) 143/103   Pulse: 78   Resp: 16   Temp: 97 ??F (36.1 ??C)   TempSrc: Temporal   SpO2: 100%   Weight: 185 lb (83.9 kg)   Height: _0  (1.702 m)  BP Readings from Last 3 Encounters:   10/25/21 (!) 143/103       NPO Status: Time of last liquid consumption: 0500                        Time of last solid consumption: 1930                        Date of last liquid consumption: 10/25/21                        Date of last solid food consumption: 10/23/21    BMI:   Wt Readings from Last 3 Encounters:   10/25/21 185 lb (83.9 kg)     Body mass index is 28.98 kg/m??.    CBC: No results found for: WBC, RBC, HGB, HCT, MCV, RDW, PLT    CMP: No results found for: NA, K, CL, CO2, BUN, CREATININE, GFRAA, AGRATIO, LABGLOM, GLUCOSE, GLU, PROT, CALCIUM, BILITOT, ALKPHOS, AST, ALT    POC Tests: No results for input(s): POCGLU, POCNA, POCK, POCCL, POCBUN, POCHEMO, POCHCT in the last 72 hours.    Coags: No results found for: PROTIME, INR, APTT    HCG (If Applicable): No results found for: PREGTESTUR, PREGSERUM, HCG, HCGQUANT     ABGs: No results found for: PHART, PO2ART, PCO2ART, HCO3ART, BEART, O2SATART     Type & Screen (If Applicable):  No results found for: LABABO, LABRH    Drug/Infectious Status (If Applicable):  No results found for: HIV, HEPCAB    COVID-19 Screening (If Applicable): No results found for: COVID19        Anesthesia Evaluation  Patient summary reviewed and Nursing notes reviewed no history of anesthetic complications:   Airway: Mallampati: II  TM distance: >3 FB   Neck ROM: full  Mouth opening: > = 3 FB   Dental: normal exam         Pulmonary:Negative Pulmonary ROS and normal exam                               Cardiovascular:Negative CV ROS                      Neuro/Psych:   Negative Neuro/Psych ROS              GI/Hepatic/Renal: Neg GI/Hepatic/Renal ROS             Endo/Other: Negative Endo/Other ROS                    Abdominal:             Vascular: negative vascular ROS.         Other Findings:           Anesthesia Plan      MAC     ASA 1       Induction: intravenous.    MIPS: Prophylactic antiemetics administered.  Anesthetic plan and risks discussed with patient.      Plan discussed with CRNA.    Attending anesthesiologist reviewed and agrees with Preprocedure content                Rochel Brome, MD   10/25/2021

## 2021-10-25 NOTE — H&P (Signed)
[facility] ENDOSCOPY  Outpatient Procedure H&P    Patient: Kathleen Newton MRN: [id number]     Date of Birth: 1955/12/01  Age: 66 y.o.  Sex: female    Unit: [facility] ENDOSCOPY Room/Bed: Endo Pool/NONE Location: [place]     Procedure: Procedure(s):  COLONOSCOPY    Indication: Family history of colon cancer [Z80.0]    Referring  Physician:          Nurses past medical history notes reviewed and agreed.   Medications reviewed.    Allergies: Amitriptyline, Ciprofloxacin, Penicillins, Sulfa antibiotics, and Trazodone     Allergies noted: Yes     Past Medical History: History reviewed. No pertinent past medical history.    Past Surgical History:   Past Surgical History:   Procedure Laterality Date    COLONOSCOPY      PLANTAR FASCIA RELEASE      WRIST SURGERY         Social History:   Social History     Socioeconomic History    Marital status: Married     Spouse name: Not on file    Number of children: Not on file    Years of education: Not on file    Highest education level: Not on file   Occupational History    Not on file   Tobacco Use    Smoking status: Never    Smokeless tobacco: Never   Vaping Use    Vaping Use: Never used   Substance and Sexual Activity    Alcohol use: Yes     Comment: 1-2 drinks weekly    Drug use: Never    Sexual activity: Not on file   Other Topics Concern    Not on file   Social History Narrative    Not on file     Social Determinants of Health     Financial Resource Strain: Not on file   Food Insecurity: Not on file   Transportation Needs: Not on file   Physical Activity: Not on file   Stress: Not on file   Social Connections: Not on file   Intimate Partner Violence: Not on file   Housing Stability: Not on file       Family History: History reviewed. No pertinent family history.    Home Medications:   Prior to Admission medications    Medication Sig Start Date End Date Taking? Authorizing Provider   ALPRAZolam (XANAX) 0.5 MG tablet TAKE 1 TABLET BY MOUTH 2 TIMES A DAY 10/09/21    Historical Provider, MD   cetirizine (ZYRTEC) 10 MG tablet Take 10 mg by mouth daily    Historical Provider, MD   traMADol (ULTRAM) 50 MG tablet Take 1 tablet by mouth every 12 (twelve) hours as needed for Pain for up to 30 days. 10/14/21   Historical Provider, MD   dicyclomine (BENTYL) 20 MG tablet TAKE 1 TABLET BY MOUTH FOUR TIMES A DAY 08/26/21   Historical Provider, MD   traZODone (DESYREL) 50 MG tablet TAKE 1 TABLET BY MOUTH AT BEDTIME 08/23/21   Historical Provider, MD       Review of Systems:  Weight Loss: No  Dysphagia: No  Dyspepsia: No  Melena: no  Chest pain: no    Physical Exam:   Vital Signs: BP (!) 143/103    Pulse 78    Temp 97 ??F (36.1 ??C) (Temporal)    Resp 16    Ht _0  (1.702 m)    Wt 185  lb (83.9 kg)    SpO2 100%    BMI 28.98 kg/m??   Vital signs reviewed:Yes    HEENT:Normal  Cardiac:Normal  Chest:Normal  Abdomen:Normal  Exts: Normal  Neuro:Normal    Labs:  No visits with results within 12 Week(s) from this visit.   Latest known visit with results is:   No results found for any previous visit.        Imaging:  No orders to display       ASA:2    Mallampati Score: II    Sedation planned: MAC    Patient in acceptable condition for procedure:Yes    10:27 AM 10/25/2021    Hunt Oris, MD      Please note that some or all of this record was generated using voice recognition software. If there are any questions about the content of this document, please contact the author as some errors in transcription may have occurred.

## 2021-10-25 NOTE — Progress Notes (Signed)
Pre-op and post-op expectations explained and pt verbalizes understanding. No new questions at this time. Husband Nadine Counts at bedside

## 2021-11-22 ENCOUNTER — Encounter: Payer: Self-pay | Admitting: Family Medicine

## 2021-11-24 ENCOUNTER — Encounter: Payer: Self-pay | Admitting: Family Medicine

## 2021-11-25 MED ORDER — ATOVAQUONE-PROGUANIL HCL 250-100 MG PO TABS: 1.0000 | ORAL_TABLET | Freq: Every day | ORAL | 0 refills | Status: DC

## 2022-01-06 ENCOUNTER — Other Ambulatory Visit: Payer: Self-pay

## 2022-01-06 ENCOUNTER — Encounter: Payer: Self-pay | Admitting: Family Medicine

## 2022-01-06 DIAGNOSIS — Z9009 Acquired absence of other part of head and neck: Secondary | ICD-10-CM

## 2022-03-15 DIAGNOSIS — Z9009 Acquired absence of other part of head and neck: Secondary | ICD-10-CM | POA: Insufficient documentation

## 2022-03-15 DIAGNOSIS — M8580 Other specified disorders of bone density and structure, unspecified site: Secondary | ICD-10-CM | POA: Insufficient documentation

## 2022-03-15 DIAGNOSIS — H9012 Conductive hearing loss, unilateral, left ear, with unrestricted hearing on the contralateral side: Secondary | ICD-10-CM | POA: Insufficient documentation

## 2022-03-15 LAB — HM DEXA SCAN

## 2022-04-07 ENCOUNTER — Encounter: Payer: Self-pay | Admitting: Family Medicine

## 2022-04-11 ENCOUNTER — Telehealth: Payer: Self-pay | Admitting: Family Medicine

## 2022-04-11 NOTE — Telephone Encounter (Signed)
Copied from CRM [id number]. Topic: Medicare AWV >> Apr 11, 2022 10:41 AM Payton Doughty wrote: Reason for CRM: Left message for patient to schedule Annual Wellness Visit.  Please schedule with Nurse Health Advisor Lanier Ensign, RN at [facility]. This appt can be telephone or office visit. Please call [phone] ask for [facility]

## 2022-04-12 ENCOUNTER — Ambulatory Visit (INDEPENDENT_AMBULATORY_CARE_PROVIDER_SITE_OTHER): Payer: Medicare Other

## 2022-04-12 ENCOUNTER — Encounter: Payer: Self-pay | Admitting: Family Medicine

## 2022-04-12 DIAGNOSIS — Z0000 Encounter for general adult medical examination without abnormal findings: Secondary | ICD-10-CM

## 2022-04-12 NOTE — Telephone Encounter (Signed)
Do you know how to add this to her chart?

## 2022-04-12 NOTE — Patient Instructions (Signed)
Ms. Monique Moore , Thank you for taking time to come for your Medicare Wellness Visit. I appreciate your ongoing commitment to your health goals. Please review the following plan we discussed and let me know if I can assist you in the future.   Screening recommendations/referrals: Colonoscopy: done 06/18/18 repeat every 5 years  Mammogram: done 07/13/21 repeat every year  Bone Density: done 03/15/22 repeat every 2 years  Recommended yearly ophthalmology/optometry visit for glaucoma screening and checkup Recommended yearly dental visit for hygiene and checkup  Vaccinations: Influenza vaccine: done 05/13/21 repeat every year  Pneumococcal vaccine: Up to date Tdap vaccine: done 03/08/20 repeat every 10 years  Shingles vaccine: completed 9/5, 07/10/18   Covid-19:completed 3/10, 4/16, 06/21/20 6/9, 07/26/21  Advanced directives: Please bring a copy of your health care power of attorney and living will to the office at your convenience.  Conditions/risks identified: stay active and healthy  Next appointment: Follow up in one year for your annual wellness visit    Preventive Care 66 Years and Older, Female Preventive care refers to lifestyle choices and visits with your health care provider that can promote health and wellness. What does preventive care include? A yearly physical exam. This is also called an annual well check. Dental exams once or twice a year. Routine eye exams. Ask your health care provider how often you should have your eyes checked. Personal lifestyle choices, including: Daily care of your teeth and gums. Regular physical activity. Eating a healthy diet. Avoiding tobacco and drug use. Limiting alcohol use. Practicing safe sex. Taking low-dose aspirin every day. Taking vitamin and mineral supplements as recommended by your health care provider. What happens during an annual well check? The services and screenings done by your health care provider during your annual well  check will depend on your age, overall health, lifestyle risk factors, and family history of disease. Counseling  Your health care provider may ask you questions about your: Alcohol use. Tobacco use. Drug use. Emotional well-being. Home and relationship well-being. Sexual activity. Eating habits. History of falls. Memory and ability to understand (cognition). Work and work Astronomer. Reproductive health. Screening  You may have the following tests or measurements: Height, weight, and BMI. Blood pressure. Lipid and cholesterol levels. These may be checked every 5 years, or more frequently if you are over 93 years old. Skin check. Lung cancer screening. You may have this screening every year starting at age 52 if you have a 30-pack-year history of smoking and currently smoke or have quit within the past 15 years. Fecal occult blood test (FOBT) of the stool. You may have this test every year starting at age 75. Flexible sigmoidoscopy or colonoscopy. You may have a sigmoidoscopy every 5 years or a colonoscopy every 10 years starting at age 40. Hepatitis C blood test. Hepatitis B blood test. Sexually transmitted disease (STD) testing. Diabetes screening. This is done by checking your blood sugar (glucose) after you have not eaten for a while (fasting). You may have this done every 1-3 years. Bone density scan. This is done to screen for osteoporosis. You may have this done starting at age 59. Mammogram. This may be done every 1-2 years. Talk to your health care provider about how often you should have regular mammograms. Talk with your health care provider about your test results, treatment options, and if necessary, the need for more tests. Vaccines  Your health care provider may recommend certain vaccines, such as: Influenza vaccine. This is recommended every year. Tetanus, diphtheria,  and acellular pertussis (Tdap, Td) vaccine. You may need a Td booster every 10 years. Zoster  vaccine. You may need this after age 41. Pneumococcal 13-valent conjugate (PCV13) vaccine. One dose is recommended after age 53. Pneumococcal polysaccharide (PPSV23) vaccine. One dose is recommended after age 18. Talk to your health care provider about which screenings and vaccines you need and how often you need them. This information is not intended to replace advice given to you by your health care provider. Make sure you discuss any questions you have with your health care provider. Document Released: 09/11/2015 Document Revised: 05/04/2016 Document Reviewed: 06/16/2015 Elsevier Interactive Patient Education  2017 [place] Prevention in the Home Falls can cause injuries. They can happen to people of all ages. There are many things you can do to make your home safe and to help prevent falls. What can I do on the outside of my home? Regularly fix the edges of walkways and driveways and fix any cracks. Remove anything that might make you trip as you walk through a door, such as a raised step or threshold. Trim any bushes or trees on the path to your home. Use bright outdoor lighting. Clear any walking paths of anything that might make someone trip, such as rocks or tools. Regularly check to see if handrails are loose or broken. Make sure that both sides of any steps have handrails. Any raised decks and porches should have guardrails on the edges. Have any leaves, snow, or ice cleared regularly. Use sand or salt on walking paths during winter. Clean up any spills in your garage right away. This includes oil or grease spills. What can I do in the bathroom? Use night lights. Install grab bars by the toilet and in the tub and shower. Do not use towel bars as grab bars. Use non-skid mats or decals in the tub or shower. If you need to sit down in the shower, use a plastic, non-slip stool. Keep the floor dry. Clean up any water that spills on the floor as soon as it happens. Remove  soap buildup in the tub or shower regularly. Attach bath mats securely with double-sided non-slip rug tape. Do not have throw rugs and other things on the floor that can make you trip. What can I do in the bedroom? Use night lights. Make sure that you have a light by your bed that is easy to reach. Do not use any sheets or blankets that are too big for your bed. They should not hang down onto the floor. Have a firm chair that has side arms. You can use this for support while you get dressed. Do not have throw rugs and other things on the floor that can make you trip. What can I do in the kitchen? Clean up any spills right away. Avoid walking on wet floors. Keep items that you use a lot in easy-to-reach places. If you need to reach something above you, use a strong step stool that has a grab bar. Keep electrical cords out of the way. Do not use floor polish or wax that makes floors slippery. If you must use wax, use non-skid floor wax. Do not have throw rugs and other things on the floor that can make you trip. What can I do with my stairs? Do not leave any items on the stairs. Make sure that there are handrails on both sides of the stairs and use them. Fix handrails that are broken or loose. Make sure  that handrails are as long as the stairways. Check any carpeting to make sure that it is firmly attached to the stairs. Fix any carpet that is loose or worn. Avoid having throw rugs at the top or bottom of the stairs. If you do have throw rugs, attach them to the floor with carpet tape. Make sure that you have a light switch at the top of the stairs and the bottom of the stairs. If you do not have them, ask someone to add them for you. What else can I do to help prevent falls? Wear shoes that: Do not have high heels. Have rubber bottoms. Are comfortable and fit you well. Are closed at the toe. Do not wear sandals. If you use a stepladder: Make sure that it is fully opened. Do not climb a  closed stepladder. Make sure that both sides of the stepladder are locked into place. Ask someone to hold it for you, if possible. Clearly mark and make sure that you can see: Any grab bars or handrails. First and last steps. Where the edge of each step is. Use tools that help you move around (mobility aids) if they are needed. These include: Canes. Walkers. Scooters. Crutches. Turn on the lights when you go into a dark area. Replace any light bulbs as soon as they burn out. Set up your furniture so you have a clear path. Avoid moving your furniture around. If any of your floors are uneven, fix them. If there are any pets around you, be aware of where they are. Review your medicines with your doctor. Some medicines can make you feel dizzy. This can increase your chance of falling. Ask your doctor what other things that you can do to help prevent falls. This information is not intended to replace advice given to you by your health care provider. Make sure you discuss any questions you have with your health care provider. Document Released: 06/11/2009 Document Revised: 01/21/2016 Document Reviewed: 09/19/2014 Elsevier Interactive Patient Education  2017 [employer].

## 2022-04-12 NOTE — Progress Notes (Addendum)
Virtual Visit via Telephone Note  I connected with  Monique Moore on 04/12/22 at  9:15 AM EDT by telephone and verified that I am speaking with the correct person using two identifiers.  Medicare Annual Wellness visit completed telephonically due to Covid-19 pandemic.   Persons participating in this call: This Health Coach and this patient.   Location: Patient: home Provider: office   I discussed the limitations, risks, security and privacy concerns of performing an evaluation and management service by telephone and the availability of in person appointments. The patient expressed understanding and agreed to proceed.  Unable to perform video visit due to video visit attempted and failed and/or patient does not have video capability.   Some vital signs may be absent or patient reported.   Marzella Schlein, LPN   Subjective:   Monique Moore is a 66 y.o. female who presents for an Initial Medicare Annual Wellness Visit.  Review of Systems     Cardiac Risk Factors include: advanced age (>39men, >53 women)     Objective:    There were no vitals filed for this visit. There is no height or weight on file to calculate BMI.     04/12/2022    9:21 AM  Advanced Directives  Does Patient Have a Medical Advance Directive? Yes  Type of Estate agent of [place];Living will  Copy of Healthcare Power of Attorney in Chart? No - copy requested    Current Medications (verified) Outpatient Encounter Medications as of 04/12/2022  Medication Sig   atovaquone-proguanil (MALARONE) 250-100 MG TABS tablet Take 1 tablet by mouth daily.   cetirizine (ZYRTEC) 10 MG tablet Take 1 tablet (10 mg total) by mouth daily.   Cyanocobalamin (VITAMIN B12 PO) Take by mouth.   fluticasone (FLONASE) 50 MCG/ACT nasal spray Place into both nostrils daily.   Multiple Vitamin (MULTIVITAMIN) tablet Take 1 tablet by mouth daily.   VITAMIN D PO Take by mouth.   Zoledronic Acid (RECLAST  IV) Inject into the vein.   Facility-Administered Encounter Medications as of 04/12/2022  Medication   zoledronic acid (RECLAST) injection 5 mg    Allergies (verified) Codeine   History: Past Medical History:  Diagnosis Date   Endometriosis 2000   endometriosis related, pain with intercourse   IBS (irritable bowel syndrome)    ? improved with diet changes   Past Surgical History:  Procedure Laterality Date   ABDOMINAL HYSTERECTOMY  2003   benign, cervix included   FOOT SURGERY     Dr. Lestine Box [facility]   HEMORRHOID SURGERY     growth on hemorrhoid-benign   LAPAROTOMY  11/00   eval endometriosis   OOPHORECTOMY     ORIF ELBOW FRACTURE  2011   performed in [place], hiking   STAPEDECTOMY  2003   otosclerosis L   Family History  Problem Relation Age of Onset   Atrial fibrillation Mother    Hypertension Mother    Arthritis Mother    Osteoporosis Mother    Cerebral aneurysm Father        hemorrhage   Diabetes Father    Hypertension Brother    Diabetes Brother    Breast cancer Neg Hx    Social History   Socioeconomic History   Marital status: Married    Spouse name: Not on file   Number of children: Not on file   Years of education: Not on file   Highest education level: Not on file  Occupational History  Not on file  Tobacco Use   Smoking status: Never   Smokeless tobacco: Never  Substance and Sexual Activity   Alcohol use: Yes    Comment: with dinner   Drug use: No   Sexual activity: Not on file  Other Topics Concern   Not on file  Social History Narrative   Married 1979. 2 children (son '85 and daughter '88). 1 grandson '15.       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       Hobbies: paint, water ski, travel   Social Determinants of Health   Financial Resource Strain: Low Risk  (04/12/2022)   Overall Financial Resource Strain (CARDIA)    Difficulty of Paying Living Expenses: Not hard at all  Food Insecurity: No  Food Insecurity (04/12/2022)   Hunger Vital Sign    Worried About Running Out of Food in the Last Year: Never true    Ran Out of Food in the Last Year: Never true  Transportation Needs: No Transportation Needs (04/12/2022)   PRAPARE - Hydrologist (Medical): No    Lack of Transportation (Non-Medical): No  Physical Activity: Sufficiently Active (04/12/2022)   Exercise Vital Sign    Days of Exercise per Week: 7 days    Minutes of Exercise per Session: 40 min  Stress: No Stress Concern Present (04/12/2022)   [place]    Feeling of Stress : Not at all  Social Connections: Moderately Integrated (04/12/2022)   Social Connection and Isolation Panel [NHANES]    Frequency of Communication with Friends and Family: More than three times a week    Frequency of Social Gatherings with Friends and Family: More than three times a week    Attends Religious Services: Never    Marine scientist or Organizations: Yes    Attends Music therapist: 1 to 4 times per year    Marital Status: Married    Tobacco Counseling Counseling given: Not Answered   Clinical Intake:  Pre-visit preparation completed: Yes  Pain : No/denies pain     Nutritional Risks: None Diabetes: No  How often do you need to have someone help you when you read instructions, pamphlets, or other written materials from your doctor or pharmacy?: 1 - Never  Diabetic?no  Interpreter Needed?: No  Information entered by :: Charlott Rakes, LPN   Activities of Daily Living    04/12/2022    9:22 AM  In your present state of health, do you have any difficulty performing the following activities:  Hearing? 1  Comment left ear hearing aids  Vision? 0  Difficulty concentrating or making decisions? 0  Walking or climbing stairs? 0  Dressing or bathing? 0  Doing errands, shopping? 0  Preparing Food and eating ? N  Using  the Toilet? N  In the past six months, have you accidently leaked urine? N  Do you have problems with loss of bowel control? N  Managing your Medications? N  Managing your Finances? N  Housekeeping or managing your Housekeeping? N    Patient Care Team: Marin Olp, MD as PCP - General (Family Medicine)  Indicate any recent Medical Services you may have received from other than [facility] providers in the past year (date may be approximate).     Assessment:   This is a routine wellness examination for [facility].  Hearing/Vision screen Hearing Screening - Comments:: Pt left hearing aid  Vision Screening - Comments:: Pt follows up with Dr Sallye Lat for annual eye exams   Dietary issues and exercise activities discussed: Current Exercise Habits: Home exercise routine, Type of exercise: Other - see comments;strength training/weights, Time (Minutes): 40, Frequency (Times/Week): 7, Weekly Exercise (Minutes/Week): 280   Goals Addressed             This Visit's Progress    Patient Stated       Stay active and healthy        Depression Screen    04/12/2022    9:20 AM 04/09/2021    3:38 PM 01/04/2021    1:54 PM 05/03/2018    8:26 AM  PHQ 2/9 Scores  PHQ - 2 Score 0 0 0 0  PHQ- 9 Score   0     Fall Risk    04/12/2022    9:22 AM 04/09/2021    3:38 PM 05/03/2018    8:26 AM  Fall Risk   Falls in the past year? 0 0 No  Number falls in past yr: 0 0   Injury with Fall? 0 0   Risk for fall due to : Impaired vision No Fall Risks   Follow up Falls prevention discussed Falls evaluation completed     FALL RISK PREVENTION PERTAINING TO THE HOME:  Any stairs in or around the home? Yes  If so, are there any without handrails? No  Home free of loose throw rugs in walkways, pet beds, electrical cords, etc? Yes  Adequate lighting in your home to reduce risk of falls? Yes   ASSISTIVE DEVICES UTILIZED TO PREVENT FALLS:  Life alert? Yes apple watch  Use of a cane, walker or w/c?  No  Grab bars in the bathroom? No  Shower chair or bench in shower? Yes  Elevated toilet seat or a handicapped toilet? No   TIMED UP AND GO:  Was the test performed? No .   Cognitive Function:        04/12/2022    9:27 AM  6CIT Screen  What Year? 0 points  What month? 0 points  What time? 0 points  Count back from 20 0 points  Months in reverse 0 points  Repeat phrase 0 points  Total Score 0 points    Immunizations Immunization History  Administered Date(s) Administered   Influenza Split 06/10/2011, 05/23/2012   Influenza Whole 07/06/2007, 06/05/2008, 05/28/2009, 07/14/2010   Influenza, High Dose Seasonal PF 05/13/2021   Influenza,inj,Quad PF,6+ Mos 06/11/2013, 06/04/2014, 06/12/2015, 06/27/2016, 06/05/2017, 05/03/2018, 05/21/2019, 05/12/2020   Influenza-Unspecified 04/25/2019, 05/21/2019, 05/13/2021   Moderna Covid-19 Vaccine Bivalent Booster 48yrs & up 07/26/2021   Moderna Sars-Covid-2 Vaccination 11/06/2019, 12/13/2019, 06/21/2020, 02/04/2021   PNEUMOCOCCAL CONJUGATE-20 04/09/2021   Tdap 12/10/2009, 02/18/2020, 03/08/2020   Unspecified SARS-COV-2 Vaccination 11/06/2019, 12/13/2019, 06/21/2020, 02/04/2021   Zoster Recombinat (Shingrix) 05/03/2018, 07/10/2018   Zoster, Live 07/10/2018    TDAP status: Up to date  Flu Vaccine status: Up to date  Pneumococcal vaccine status: Up to date  Covid-19 vaccine status: Completed vaccines  Qualifies for Shingles Vaccine? Yes   Zostavax completed Yes   Shingrix Completed?: Yes  Screening Tests Health Maintenance  Topic Date Due   INFLUENZA VACCINE  03/29/2022   COLONOSCOPY (Pts 45-23yrs Insurance coverage will need to be confirmed)  06/19/2023   MAMMOGRAM  07/14/2023   TETANUS/TDAP  03/08/2030   Pneumonia Vaccine 8+ Years old  Completed   DEXA SCAN  Completed   COVID-19 Vaccine  Completed   Hepatitis  C Screening  Completed   Zoster Vaccines- Shingrix  Completed   HPV VACCINES  Aged Out    Health  Maintenance  Health Maintenance Due  Topic Date Due   INFLUENZA VACCINE  03/29/2022    Colorectal cancer screening: Type of screening: Colonoscopy. Completed 06/18/18. Repeat every 5 years  Mammogram status: Completed 07/13/21. Repeat every year  Bone density 03/15/22 pt stated osteopenia pt stated reclast infusion completed on 07/14/21    Additional Screening:  Hepatitis C Screening:  Completed 03/14/16  Vision Screening: Recommended annual ophthalmology exams for early detection of glaucoma and other disorders of the eye. Is the patient up to date with their annual eye exam?  Yes  Who is the provider or what is the name of the office in which the patient attends annual eye exams? Dr Warden Fillers  If pt is not established with a provider, would they like to be referred to a provider to establish care? No .   Dental Screening: Recommended annual dental exams for proper oral hygiene  Community Resource Referral / Chronic Care Management: CRR required this visit?  No   CCM required this visit?  No      Plan:     I have personally reviewed and noted the following in the patient's chart:   Medical and social history Use of alcohol, tobacco or illicit drugs  Current medications and supplements including opioid prescriptions. Patient is not currently taking opioid prescriptions. Functional ability and status Nutritional status Physical activity Advanced directives List of other physicians Hospitalizations, surgeries, and ER visits in previous 12 months Vitals Screenings to include cognitive, depression, and falls Referrals and appointments  In addition, I have reviewed and discussed with patient certain preventive protocols, quality metrics, and best practice recommendations. A written personalized care plan for preventive services as well as general preventive health recommendations were provided to patient.     Willette Brace, LPN   [id number]   Nurse Notes:  none

## 2022-05-16 ENCOUNTER — Telehealth: Payer: Medicare Other | Admitting: Nurse Practitioner

## 2022-05-16 DIAGNOSIS — N3000 Acute cystitis without hematuria: Secondary | ICD-10-CM | POA: Diagnosis not present

## 2022-05-16 MED ORDER — CEPHALEXIN 500 MG PO CAPS: 500.0000 mg | ORAL_CAPSULE | Freq: Two times a day (BID) | ORAL | 0 refills | Status: AC

## 2022-05-16 NOTE — Progress Notes (Signed)
E-Visit for Urinary Problems  We are sorry that you are not feeling well.  Here is how we plan to help!  Based on what you shared with me it looks like you most likely have a simple urinary tract infection.  A UTI (Urinary Tract Infection) is a bacterial infection of the bladder.  Most cases of urinary tract infections are simple to treat but a key part of your care is to encourage you to drink plenty of fluids and watch your symptoms carefully.  I have prescribed Keflex 500 mg twice a day for 7 days.  Your symptoms should gradually improve. Call us if the burning in your urine worsens, you develop worsening fever, back pain or pelvic pain or if your symptoms do not resolve after completing the antibiotic.  Urinary tract infections can be prevented by drinking plenty of water to keep your body hydrated.  Also be sure when you wipe, wipe from front to back and don't hold it in!  If possible, empty your bladder every 4 hours.  HOME CARE Drink plenty of fluids Compete the full course of the antibiotics even if the symptoms resolve Remember, when you need to go.go. Holding in your urine can increase the likelihood of getting a UTI! GET HELP RIGHT AWAY IF: You cannot urinate You get a high fever Worsening back pain occurs You see blood in your urine You feel sick to your stomach or throw up You feel like you are going to pass out  MAKE SURE YOU  Understand these instructions. Will watch your condition. Will get help right away if you are not doing well or get worse.   Thank you for choosing an e-visit.  Your e-visit answers were reviewed by a board certified advanced clinical practitioner to complete your personal care plan. Depending upon the condition, your plan could have included both over the counter or prescription medications.  Please review your pharmacy choice. Make sure the pharmacy is open so you can pick up prescription now. If there is a problem, you may contact your  provider through MyChart messaging and have the prescription routed to another pharmacy.  Your safety is important to us. If you have drug allergies check your prescription carefully.   For the next 24 hours you can use MyChart to ask questions about today's visit, request a non-urgent call back, or ask for a work or school excuse. You will get an email in the next two days asking about your experience. I hope that your e-visit has been valuable and will speed your recovery.   Meds ordered this encounter  Medications   cephALEXin (KEFLEX) 500 MG capsule    Sig: Take 1 capsule (500 mg total) by mouth 2 (two) times daily for 7 days.    Dispense:  14 capsule    Refill:  0    I spent approximately 5 minutes reviewing the patient's history, current symptoms and coordinating their plan of care today.   

## 2022-05-18 ENCOUNTER — Encounter: Payer: Self-pay | Admitting: Family Medicine

## 2022-05-30 ENCOUNTER — Other Ambulatory Visit: Payer: Self-pay | Admitting: Obstetrics and Gynecology

## 2022-05-30 DIAGNOSIS — Z1231 Encounter for screening mammogram for malignant neoplasm of breast: Secondary | ICD-10-CM

## 2022-06-07 ENCOUNTER — Encounter: Payer: Self-pay | Admitting: Family Medicine

## 2022-06-07 ENCOUNTER — Ambulatory Visit (INDEPENDENT_AMBULATORY_CARE_PROVIDER_SITE_OTHER): Payer: Medicare Other | Admitting: Family Medicine

## 2022-06-07 VITALS — BP 108/68 | HR 74 | Temp 98.0°F | Ht 63.0 in | Wt 118.8 lb

## 2022-06-07 DIAGNOSIS — R590 Localized enlarged lymph nodes: Secondary | ICD-10-CM | POA: Diagnosis not present

## 2022-06-07 NOTE — Patient Instructions (Addendum)
[place] Schedule an appointment by calling [phone].  Recommended follow up: Return for as needed for new, worsening, persistent symptoms.

## 2022-06-07 NOTE — Progress Notes (Signed)
Phone [phone] In person visit   Subjective:   Monique Moore is a 66 y.o. year old very pleasant female patient who presents for/with See problem oriented charting Chief Complaint  Patient presents with   Pain Left arm area    Pt c/o left arm pit, pt states that aching pain, started a week ago Wednesday got worse, took Advil for pain, helped but didn't take away pain   Past Medical History-  Patient Active Problem List   Diagnosis Date Noted   Hyperglycemia 08/12/2014    Priority: Medium    Thumb swelling 07/12/2014    Priority: Medium    Osteoporosis 08/12/2014    Priority: Low   Allergic rhinitis 08/12/2014    Priority: Low   MRI >1.5 T contraindicated due to metal implant 07/16/2014    Priority: Low   Conductive hearing loss of left ear with unrestricted hearing of right ear 03/15/2022   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 14 tablet 0   cetirizine (ZYRTEC) 10 MG tablet Take 1 tablet (10 mg total) by mouth daily. 90 tablet 3   Cyanocobalamin (VITAMIN B12 PO) Take by mouth.     fluticasone (FLONASE) 50 MCG/ACT nasal spray Place into both nostrils daily.     Multiple Vitamin (MULTIVITAMIN) tablet Take 1 tablet by mouth daily.     VITAMIN D PO Take by mouth.     Zoledronic Acid (RECLAST IV) Inject into the vein.     Current Facility-Administered Medications  Medication Dose Route Frequency Provider Last Rate Last Admin   zoledronic acid (RECLAST) injection 5 mg  5 mg Intravenous Once Shelva Majestic, MD         Objective:  BP 108/68   Pulse 74   Temp 98 F (36.7 C)   Ht 5\' 3"  (1.6 m)   Wt 118  lb 12.8 oz (53.9 kg)   SpO2 100%   BMI 21.04 kg/m   CV: RRR  Lungs: nonlabored, normal respiratory rate Abdomen: soft/nondistended  Right axilla is normal.  Left axilla with tender lymphadenopathy about 2 x 2 cm-does not extend into the chest    Assessment and Plan   #Left axilla pain S: Patient complains of left axilla pain-described as an aching sensation 7-8/10 even at rest.  Started a week ago on Wednesday 4th of October (up to 9/10).  Was so significant when first started- tried aspercreme and advil that time. Has tried Advil  since then with only mild relief (helped more first day). Has tried heating pad and temporary relief. No injury. Does strength training every other day but does not remember pain with exercise or post exercise and took days off afterwads. Has not been skiing in weeks. No breast lumps on check yesterday- mammogram scheduled in December for annual- up to date. No rash on skin  -No new medications recently.  Did have flu shot in September on the 19th but no clear correlation with this. No new covid shot yet.  -trip in December- planning on yellow fever vaccine this weekend but will push out -No chest pain or shortness of breath was noted-no left arm or neck pain outside of  the axilla A/P: Pain in left axilla-appears to be around area of lymphadenopathy.  I do not think this is muscular strain and with level of pain would like to evaluate more with targeted ultrasound of the left axilla-also ordered diagnostic mammogram on the left breast but if radiologist does not think this is necessary we can discontinue this portion of the testing - No recent COVID shots which can be associated with painful lymphadenopathy.  Flu shot several weeks ago but has not had issues like this in the past. - Discussed monitoring and conservative care if reassuring ultrasound/mammogram (which were ordered as urgent)  Recommended follow up: Return for as needed for new, worsening, persistent  symptoms. Future Appointments  Date Time Provider [place]  04/20/2023  9:30 AM [facility]    Lab/Order associations:   ICD-10-CM   1. Axillary lymphadenopathy  R59.0 US BREAST LTD UNI LEFT INC AXILLA    MM Digital Diagnostic Unilat L     Return precautions advised.  Garret Reddish, MD

## 2022-06-09 ENCOUNTER — Ambulatory Visit: Payer: Medicare Other | Admitting: Family Medicine

## 2022-06-18 ENCOUNTER — Ambulatory Visit
Admission: RE | Admit: 2022-06-18 | Discharge: 2022-06-18 | Disposition: A | Discharge status: Home / Self Care | Payer: Medicare Other | Source: Ambulatory Visit | Attending: Family Medicine | Admitting: Family Medicine

## 2022-06-18 ENCOUNTER — Other Ambulatory Visit: Payer: Self-pay | Admitting: Family Medicine

## 2022-06-18 DIAGNOSIS — R590 Localized enlarged lymph nodes: Secondary | ICD-10-CM

## 2022-06-25 ENCOUNTER — Encounter: Payer: Self-pay | Admitting: Family Medicine

## 2022-07-04 LAB — LIPID PANEL
Cholesterol: 186 (ref 0–200)
HDL: 95 — AB (ref 35–70)
Triglycerides: 78 (ref 40–160)

## 2022-07-04 LAB — CBC AND DIFFERENTIAL
HCT: 39 (ref 36–46)
Hemoglobin: 13.1 (ref 12.0–16.0)
Platelets: 242 10*3/uL (ref 150–400)
WBC: 3.6

## 2022-07-04 LAB — BASIC METABOLIC PANEL
BUN: 10 (ref 4–21)
CO2: 22 (ref 13–22)
Chloride: 102 (ref 99–108)
Creatinine: 1 (ref 0.5–1.1)
Glucose: 101
Potassium: 4.4 mEq/L (ref 3.5–5.1)
Sodium: 139 (ref 137–147)

## 2022-07-04 LAB — TSH: TSH: 3.27 (ref 0.41–5.90)

## 2022-07-04 LAB — HEMOGLOBIN A1C: Hemoglobin A1C: 6

## 2022-07-04 LAB — CBC: RBC: 4.35 (ref 3.87–5.11)

## 2022-07-04 LAB — HEPATIC FUNCTION PANEL
ALT: 13 U/L (ref 7–35)
AST: 20 (ref 13–35)
Alkaline Phosphatase: 91 (ref 25–125)
Bilirubin, Total: 2

## 2022-07-04 LAB — COMPREHENSIVE METABOLIC PANEL
Albumin: 4.5 (ref 3.5–5.0)
Calcium: 9.1 (ref 8.7–10.7)
Globulin: 2
eGFR: 65

## 2022-07-05 ENCOUNTER — Encounter: Payer: Self-pay | Admitting: Family Medicine

## 2022-07-13 ENCOUNTER — Encounter: Payer: Self-pay | Admitting: Family Medicine

## 2022-07-14 ENCOUNTER — Other Ambulatory Visit: Payer: Medicare Other

## 2022-08-01 ENCOUNTER — Ambulatory Visit: Payer: Medicare Other

## 2022-08-31 ENCOUNTER — Encounter: Payer: Self-pay | Admitting: Family Medicine

## 2023-03-10 ENCOUNTER — Encounter: Payer: Self-pay | Admitting: Family Medicine

## 2023-03-29 ENCOUNTER — Encounter (INDEPENDENT_AMBULATORY_CARE_PROVIDER_SITE_OTHER): Payer: Self-pay

## 2023-04-24 ENCOUNTER — Ambulatory Visit (INDEPENDENT_AMBULATORY_CARE_PROVIDER_SITE_OTHER): Payer: Medicare Other

## 2023-04-24 VITALS — Ht 62.0 in | Wt 112.0 lb

## 2023-04-24 DIAGNOSIS — Z0000 Encounter for general adult medical examination without abnormal findings: Secondary | ICD-10-CM | POA: Diagnosis not present

## 2023-04-24 NOTE — Progress Notes (Signed)
 Because this visit was a virtual/telehealth visit,  certain criteria was not obtained, such a blood pressure, CBG if patient is a diabetic, and timed get up and go. Any medications not marked as "taking" was not mentioned during the medication reconciliation part of the visit. Any vitals not documented were not able to be obtained due to this being a telehealth visit. Vitals that have been documented are verbally provided by the patient.  Patient was unable to self-report a recent blood pressure reading due to a lack of equipment at home via telehealth.  Subjective:   Monique Moore is a 67 y.o. female who presents for Medicare Annual (Subsequent) preventive examination.  Visit Complete: Virtual  I connected with  [name] on 04/24/23 by a audio enabled telemedicine application and verified that I am speaking with the correct person using two identifiers.  Patient Location: Home  Provider Location: Home Office  I discussed the limitations of evaluation and management by telemedicine. The patient expressed understanding and agreed to proceed.  Patient Medicare AWV questionnaire was completed by the patient on 04/21/2023; I have confirmed that all information answered by patient is correct and no changes since this date.  Review of Systems     Cardiac Risk Factors include: advanced age (>57men, >5 women)     Objective:    Today's Vitals   04/24/23 1448  Weight: 112 lb (50.8 kg)  Height: 5\' 2"  (1.575 m)   Body mass index is 20.49 kg/m.     04/24/2023    2:48 PM 04/12/2022    9:21 AM  Advanced Directives  Does Patient Have a Medical Advance Directive? No Yes  Type of Special educational needs teacher of [place];Living will  Copy of Healthcare Power of Attorney in Chart?  No - copy requested  Would patient like information on creating a medical advance directive? No - Patient declined     Current Medications (verified) Outpatient Encounter Medications as of  04/24/2023  Medication Sig   cetirizine (ZYRTEC) 10 MG tablet Take 1 tablet (10 mg total) by mouth daily.   Cyanocobalamin (VITAMIN B12 PO) Take by mouth.   fluticasone (FLONASE) 50 MCG/ACT nasal spray Place into both nostrils daily.   Multiple Vitamin (MULTIVITAMIN) tablet Take 1 tablet by mouth daily.   VITAMIN D PO Take by mouth.   atovaquone-proguanil (MALARONE) 250-100 MG TABS tablet Take 1 tablet by mouth daily.   Zoledronic Acid (RECLAST IV) Inject into the vein.   Facility-Administered Encounter Medications as of 04/24/2023  Medication   zoledronic acid (RECLAST) injection 5 mg    Allergies (verified) Codeine   History: Past Medical History:  Diagnosis Date   Endometriosis 2000   endometriosis related, pain with intercourse   IBS (irritable bowel syndrome)    ? improved with diet changes   Past Surgical History:  Procedure Laterality Date   ABDOMINAL HYSTERECTOMY  2003   benign, cervix included   FOOT SURGERY     Dr. Lestine Box [facility]   HEMORRHOID SURGERY     growth on hemorrhoid-benign   LAPAROTOMY  11/00   eval endometriosis   OOPHORECTOMY     ORIF ELBOW FRACTURE  2011   performed in [place], hiking   STAPEDECTOMY  2003   otosclerosis L   Family History  Problem Relation Age of Onset   Atrial fibrillation Mother    Hypertension Mother    Arthritis Mother    Osteoporosis Mother    Cerebral aneurysm Father  hemorrhage   Diabetes Father    Hypertension Brother    Diabetes Brother    Breast cancer Neg Hx    Social History   Socioeconomic History   Marital status: Married    Spouse name: Not on file   Number of children: Not on file   Years of education: Not on file   Highest education level: Not on file  Occupational History   Not on file  Tobacco Use   Smoking status: Never   Smokeless tobacco: Never  Substance and Sexual Activity   Alcohol use: Yes    Comment: with dinner   Drug use: No   Sexual activity: Not on file  Other Topics  Concern   Not on file  Social History Narrative   Married 1979. 2 children (son '85 and daughter '88). 1 grandson '15.       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       Hobbies: paint, water ski, travel   Social Determinants of Health   Financial Resource Strain: Low Risk  (04/21/2023)   Overall Financial Resource Strain (CARDIA)    Difficulty of Paying Living Expenses: Not hard at all  Food Insecurity: No Food Insecurity (04/21/2023)   Hunger Vital Sign    Worried About Running Out of Food in the Last Year: Never true    Ran Out of Food in the Last Year: Never true  Transportation Needs: No Transportation Needs (04/21/2023)   PRAPARE - Administrator, Civil Service (Medical): No    Lack of Transportation (Non-Medical): No  Physical Activity: Sufficiently Active (04/21/2023)   Exercise Vital Sign    Days of Exercise per Week: 7 days    Minutes of Exercise per Session: 50 min  Stress: No Stress Concern Present (04/21/2023)   [employer] of Occupational Health - Occupational Stress Questionnaire    Feeling of Stress : Only a little  Social Connections: Moderately Integrated (04/21/2023)   Social Connection and Isolation Panel [NHANES]    Frequency of Communication with Friends and Family: More than three times a week    Frequency of Social Gatherings with Friends and Family: More than three times a week    Attends Religious Services: Never    Database administrator or Organizations: Yes    Attends Engineer, structural: More than 4 times per year    Marital Status: Married    Tobacco Counseling Counseling given: Yes   Clinical Intake:  Pre-visit preparation completed: Yes  Pain : No/denies pain     BMI - recorded: 20.49 Nutritional Status: BMI of 19-24  Normal Nutritional Risks: None Diabetes: No  How often do you need to have someone help you when you read instructions, pamphlets, or other written materials from  your doctor or pharmacy?: 1 - Never  Interpreter Needed?: No  Information entered by ::  Sagan Maselli, CMA   Activities of Daily Living    04/21/2023    3:24 PM  In your present state of health, do you have any difficulty performing the following activities:  Hearing? 1  Vision? 0  Difficulty concentrating or making decisions? 0  Walking or climbing stairs? 0  Dressing or bathing? 0  Doing errands, shopping? 0  Preparing Food and eating ? N  Using the Toilet? N  In the past six months, have you accidently leaked urine? N  Do you have problems with loss of bowel control? N  Managing your Medications? N  Managing your  Finances? N  Housekeeping or managing your Housekeeping? N    Patient Care Team: Shelva Majestic, MD as PCP - General (Family Medicine)  Indicate any recent Medical Services you may have received from other than [facility] providers in the past year (date may be approximate).     Assessment:   This is a routine wellness examination for [facility].  Hearing/Vision screen Hearing Screening - Comments:: Patient denies any hearing difficulties.   Vision Screening - Comments:: Patient states they wear reading glasses only.  She is up to date with yearly eye exams with Dr. Sallye Lat   Dietary issues and exercise activities discussed:     Goals Addressed             This Visit's Progress    Patient Stated       Stay as healthy as possible        Depression Screen    04/24/2023    2:52 PM 04/12/2022    9:20 AM 04/09/2021    3:38 PM 01/04/2021    1:54 PM 05/03/2018    8:26 AM  PHQ 2/9 Scores  PHQ - 2 Score 0 0 0 0 0  PHQ- 9 Score    0     Fall Risk    04/21/2023    3:24 PM 04/12/2022    9:22 AM 04/09/2021    3:38 PM 05/03/2018    8:26 AM  Fall Risk   Falls in the past year? 0 0 0 No  Number falls in past yr: 0 0 0   Injury with Fall? 0 0 0   Risk for fall due to : No Fall Risks Impaired vision No Fall Risks   Follow up Falls prevention  discussed Falls prevention discussed Falls evaluation completed     MEDICARE RISK AT HOME: Medicare Risk at Home Any stairs in or around the home?: Yes If so, are there any without handrails?: No Home free of loose throw rugs in walkways, pet beds, electrical cords, etc?: Yes Adequate lighting in your home to reduce risk of falls?: Yes Life alert?: No Use of a cane, walker or w/c?: No Grab bars in the bathroom?: No Shower chair or bench in shower?: Yes Elevated toilet seat or a handicapped toilet?: No  TIMED UP AND GO:  Was the test performed?  No    Cognitive Function:        04/24/2023    2:51 PM 04/12/2022    9:27 AM  6CIT Screen  What Year? 0 points 0 points  What month? 0 points 0 points  What time? 0 points 0 points  Count back from 20 0 points 0 points  Months in reverse 0 points 0 points  Repeat phrase 0 points 0 points  Total Score 0 points 0 points    Immunizations Immunization History  Administered Date(s) Administered   Influenza Split 06/10/2011, 05/23/2012   Influenza Whole 07/06/2007, 06/05/2008, 05/28/2009, 07/14/2010   Influenza, High Dose Seasonal PF 05/13/2021   Influenza,inj,Quad PF,6+ Mos 06/11/2013, 06/04/2014, 06/12/2015, 06/27/2016, 06/05/2017, 05/03/2018, 05/21/2019, 05/12/2020   Influenza-Unspecified 04/25/2019, 05/21/2019, 05/13/2021, 05/17/2022   Moderna Covid-19 Vaccine Bivalent Booster 2yrs & up 07/26/2021   Moderna SARS-COV2 Booster Vaccination 07/13/2022   Moderna Sars-Covid-2 Vaccination 11/06/2019, 12/13/2019, 06/21/2020, 02/04/2021   PNEUMOCOCCAL CONJUGATE-20 04/09/2021   Tdap 12/10/2009, 02/18/2020, 03/08/2020   Unspecified SARS-COV-2 Vaccination 11/06/2019, 12/13/2019, 06/21/2020, 02/04/2021   Yellow Fever 06/23/2022   Zoster Recombinant(Shingrix) 05/03/2018, 07/10/2018   Zoster, Live 07/10/2018    TDAP  status: Up to date  Flu Vaccine status: Due, Education has been provided regarding the importance of this vaccine. Advised  may receive this vaccine at local pharmacy or Health Dept. Aware to provide a copy of the vaccination record if obtained from local pharmacy or Health Dept. Verbalized acceptance and understanding.  Pneumococcal vaccine status: Up to date  Covid-19 vaccine status: Information provided on how to obtain vaccines.   Qualifies for Shingles Vaccine? Yes   Zostavax completed No   Shingrix Completed?: Yes  Screening Tests Health Maintenance  Topic Date Due   COVID-19 Vaccine (11 - 2023-24 season) 09/07/2022   Medicare Annual Wellness (AWV)  04/13/2023   INFLUENZA VACCINE  03/30/2023   Colonoscopy  06/19/2023   MAMMOGRAM  06/19/2023   DTaP/Tdap/Td (4 - Td or Tdap) 03/08/2030   Pneumonia Vaccine 52+ Years old  Completed   DEXA SCAN  Completed   Hepatitis C Screening  Completed   Zoster Vaccines- Shingrix  Completed   HPV VACCINES  Aged Out    Health Maintenance  Health Maintenance Due  Topic Date Due   COVID-19 Vaccine (11 - 2023-24 season) 09/07/2022   Medicare Annual Wellness (AWV)  04/13/2023   INFLUENZA VACCINE  03/30/2023   Colonoscopy  06/19/2023    Colorectal cancer screening: Type of screening: Colonoscopy. Completed 06/18/2018. Repeat every 5 years  Mammogram status: Ordered 04/24/2023. Pt provided with contact info and advised to call to schedule appt.   Bone Density status: Completed 03/15/2022. Results reflect: Bone density results: OSTEOPENIA. Repeat every 2 years.  Lung Cancer Screening: (Low Dose CT Chest recommended if Age 82-80 years, 20 pack-year currently smoking OR have quit w/in 15years.) does not qualify.   Lung Cancer Screening Referral: na  Additional Screening:  Hepatitis C Screening: does not qualify; Completed 03/14/2016  Vision Screening: Recommended annual ophthalmology exams for early detection of glaucoma and other disorders of the eye. Is the patient up to date with their annual eye exam?  Yes  Who is the provider or what is the name of the  office in which the patient attends annual eye exams? Dr. Sallye Lat If pt is not established with a provider, would they like to be referred to a provider to establish care? No .   Dental Screening: Recommended annual dental exams for proper oral hygiene  Diabetic Foot Exam: na  Community Resource Referral / Chronic Care Management: CRR required this visit?  No   CCM required this visit?  No     Plan:     I have personally reviewed and noted the following in the patient's chart:   Medical and social history Use of alcohol, tobacco or illicit drugs  Current medications and supplements including opioid prescriptions. Patient is not currently taking opioid prescriptions. Functional ability and status Nutritional status Physical activity Advanced directives List of other physicians Hospitalizations, surgeries, and ER visits in previous 12 months Vitals Screenings to include cognitive, depression, and falls Referrals and appointments  In addition, I have reviewed and discussed with patient certain preventive protocols, quality metrics, and best practice recommendations. A written personalized care plan for preventive services as well as general preventive health recommendations were provided to patient.     Jordan Hawks Alegandro Macnaughton, CMA   04/24/2023   After Visit Summary: (MyChart) Due to this being a telephonic visit, the after visit summary with patients personalized plan was offered to patient via MyChart   Nurse Notes:

## 2023-04-24 NOTE — Patient Instructions (Signed)
Ms. Maiorino , Thank you for taking time to come for your Medicare Wellness Visit. I appreciate your ongoing commitment to your health goals. Please review the following plan we discussed and let me know if I can assist you in the future.   Referrals/Orders/Follow-Ups/Clinician Recommendations:  You have an order for:  []   2D Mammogram  [x]   3D Mammogram  []   Bone Density     Please call for appointment:   [facility] [address] [place], [place] [address] [phone]  Make sure to wear two-piece clothing.  No lotions powders or deodorants the day of the appointment Make sure to bring picture ID and insurance card.  Bring list of medications you are currently taking including any supplements.   Schedule your [place] screening mammogram through MyChart!   Log into your MyChart account.  Go to 'Visit' (or 'Appointments' if on mobile App) --> Schedule an Appointment  Under 'Select a Reason for Visit' choose the Mammogram Screening option.  Complete the pre-visit questions and select the time and place that best fits your schedule.    This is a list of the screening recommended for you and due dates:  Health Maintenance  Topic Date Due   COVID-19 Vaccine (11 - 2023-24 season) 09/07/2022   Flu Shot  03/30/2023   Colon Cancer Screening  06/19/2023   Mammogram  06/19/2023   Medicare Annual Wellness Visit  04/23/2024   DTaP/Tdap/Td vaccine (4 - Td or Tdap) 03/08/2030   Pneumonia Vaccine  Completed   DEXA scan (bone density measurement)  Completed   Hepatitis C Screening  Completed   Zoster (Shingles) Vaccine  Completed   HPV Vaccine  Aged Out    Advanced directives: (Declined) Advance directive discussed with you today. Even though you declined this today, please call our office should you change your mind, and we can give you the proper paperwork for you to fill out.  Next Medicare Annual Wellness Visit scheduled for next year: Yes Preventive  Care 51 Years and Older, Female Preventive care refers to lifestyle choices and visits with your health care provider that can promote health and wellness. Preventive care visits are also called wellness exams. What can I expect for my preventive care visit? Counseling Your health care provider may ask you questions about your: Medical history, including: Past medical problems. Family medical history. Pregnancy and menstrual history. History of falls. Current health, including: Memory and ability to understand (cognition). Emotional well-being. Home life and relationship well-being. Sexual activity and sexual health. Lifestyle, including: Alcohol, nicotine or tobacco, and drug use. Access to firearms. Diet, exercise, and sleep habits. Work and work Astronomer. Sunscreen use. Safety issues such as seatbelt and bike helmet use. Physical exam Your health care provider will check your: Height and weight. These may be used to calculate your BMI (body mass index). BMI is a measurement that tells if you are at a healthy weight. Waist circumference. This measures the distance around your waistline. This measurement also tells if you are at a healthy weight and may help predict your risk of certain diseases, such as type 2 diabetes and high blood pressure. Heart rate and blood pressure. Body temperature. Skin for abnormal spots. What immunizations do I need?  Vaccines are usually given at various ages, according to a schedule. Your health care provider will recommend vaccines for you based on your age, medical history, and lifestyle or other factors, such as travel or where you work. What tests do  I need? Screening Your health care provider may recommend screening tests for certain conditions. This may include: Lipid and cholesterol levels. Hepatitis C test. Hepatitis B test. HIV (human immunodeficiency virus) test. STI (sexually transmitted infection) testing, if you are at  risk. Lung cancer screening. Colorectal cancer screening. Diabetes screening. This is done by checking your blood sugar (glucose) after you have not eaten for a while (fasting). Mammogram. Talk with your health care provider about how often you should have regular mammograms. BRCA-related cancer screening. This may be done if you have a family history of breast, ovarian, tubal, or peritoneal cancers. Bone density scan. This is done to screen for osteoporosis. Talk with your health care provider about your test results, treatment options, and if necessary, the need for more tests. Follow these instructions at home: Eating and drinking  Eat a diet that includes fresh fruits and vegetables, whole grains, lean protein, and low-fat dairy products. Limit your intake of foods with high amounts of sugar, saturated fats, and salt. Take vitamin and mineral supplements as recommended by your health care provider. Do not drink alcohol if your health care provider tells you not to drink. If you drink alcohol: Limit how much you have to 0-1 drink a day. Know how much alcohol is in your drink. In the [place] one drink equals one 12 oz bottle of beer (355 mL), one 5 oz glass of wine (148 mL), or one 1 oz glass of hard liquor (44 mL). Lifestyle Brush your teeth every morning and night with fluoride toothpaste. Floss one time each day. Exercise for at least 30 minutes 5 or more days each week. Do not use any products that contain nicotine or tobacco. These products include cigarettes, chewing tobacco, and vaping devices, such as e-cigarettes. If you need help quitting, ask your health care provider. Do not use drugs. If you are sexually active, practice safe sex. Use a condom or other form of protection in order to prevent STIs. Take aspirin only as told by your health care provider. Make sure that you understand how much to take and what form to take. Work with your health care provider to find out whether it  is safe and beneficial for you to take aspirin daily. Ask your health care provider if you need to take a cholesterol-lowering medicine (statin). Find healthy ways to manage stress, such as: Meditation, yoga, or listening to music. Journaling. Talking to a trusted person. Spending time with friends and family. Minimize exposure to UV radiation to reduce your risk of skin cancer. Safety Always wear your seat belt while driving or riding in a vehicle. Do not drive: If you have been drinking alcohol. Do not ride with someone who has been drinking. When you are tired or distracted. While texting. If you have been using any mind-altering substances or drugs. Wear a helmet and other protective equipment during sports activities. If you have firearms in your house, make sure you follow all gun safety procedures. What's next? Visit your health care provider once a year for an annual wellness visit. Ask your health care provider how often you should have your eyes and teeth checked. Stay up to date on all vaccines. This information is not intended to replace advice given to you by your health care provider. Make sure you discuss any questions you have with your health care provider. Document Revised: 02/10/2021 Document Reviewed: 02/10/2021 Elsevier Patient Education  2024 [employer]. Understanding Your Risk for Falls Millions of people have serious  injuries from falls each year. It is important to understand your risk of falling. Talk with your health care provider about your risk and what you can do to lower it. If you do have a serious fall, make sure to tell your provider. Falling once raises your risk of falling again. How can falls affect me? Serious injuries from falls are common. These include: Broken bones, such as hip fractures. Head injuries, such as traumatic brain injuries (TBI) or concussions. A fear of falling can cause you to avoid activities and stay at home. This can make  your muscles weaker and raise your risk for a fall. What can increase my risk? There are a number of risk factors that increase your risk for falling. The more risk factors you have, the higher your risk of falling. Serious injuries from a fall happen most often to people who are older than 67 years old. Teenagers and young adults ages 1-29 are also at higher risk. Common risk factors include: Weakness in the lower body. Being generally weak or confused due to long-term (chronic) illness. Dizziness or balance problems. Poor vision. Medicines that cause dizziness or drowsiness. These may include: Medicines for your blood pressure, heart, anxiety, insomnia, or swelling (edema). Pain medicines. Muscle relaxants. Other risk factors include: Drinking alcohol. Having had a fall in the past. Having foot pain or wearing improper footwear. Working at a dangerous job. Having any of the following in your home: Tripping hazards, such as floor clutter or loose rugs. Poor lighting. Pets. Having dementia or memory loss. What actions can I take to lower my risk of falling?     Physical activity Stay physically fit. Do strength and balance exercises. Consider taking a regular class to build strength and balance. Yoga and tai chi are good options. Vision Have your eyes checked every year and your prescription for glasses or contacts updated as needed. Shoes and walking aids Wear non-skid shoes. Wear shoes that have rubber soles and low heels. Do not wear high heels. Do not walk around the house in socks or slippers. Use a cane or walker as told by your provider. Home safety Attach secure railings on both sides of your stairs. Install grab bars for your bathtub, shower, and toilet. Use a non-skid mat in your bathtub or shower. Attach bath mats securely with double-sided, non-slip rug tape. Use good lighting in all rooms. Keep a flashlight near your bed. Make sure there is a clear path from your  bed to the bathroom. Use night-lights. Do not use throw rugs. Make sure all carpeting is taped or tacked down securely. Remove all clutter from walkways and stairways, including extension cords. Repair uneven or broken steps and floors. Avoid walking on icy or slippery surfaces. Walk on the grass instead of on icy or slick sidewalks. Use ice melter to get rid of ice on walkways in the winter. Use a cordless phone. Questions to ask your health care provider Can you help me check my risk for a fall? Do any of my medicines make me more likely to fall? Should I take a vitamin D supplement? What exercises can I do to improve my strength and balance? Should I make an appointment to have my vision checked? Do I need a bone density test to check for weak bones (osteoporosis)? Would it help to use a cane or a walker? Where to find more information Centers for Disease Control and Prevention, STEADI: TonerPromos.no Community-Based Fall Prevention Programs: TonerPromos.no [employer] on Aging:  BaseRingTones.pl Contact a health care provider if: You fall at home. You are afraid of falling at home. You feel weak, drowsy, or dizzy. This information is not intended to replace advice given to you by your health care provider. Make sure you discuss any questions you have with your health care provider. Document Revised: 04/18/2022 Document Reviewed: 04/18/2022 Elsevier Patient Education  2024 [employer].

## 2023-05-16 ENCOUNTER — Encounter: Payer: Self-pay | Admitting: Family Medicine

## 2023-05-16 ENCOUNTER — Other Ambulatory Visit: Payer: Self-pay | Admitting: Family Medicine

## 2023-05-16 DIAGNOSIS — Z1231 Encounter for screening mammogram for malignant neoplasm of breast: Secondary | ICD-10-CM

## 2023-06-27 ENCOUNTER — Encounter: Payer: Self-pay | Admitting: Family Medicine

## 2023-06-27 NOTE — Telephone Encounter (Signed)
Thank You.

## 2023-06-27 NOTE — Telephone Encounter (Signed)
Patient has been scheduled for 11/5 @ 9 am w/ Dr. Ruthine Dose.

## 2023-07-04 ENCOUNTER — Ambulatory Visit (INDEPENDENT_AMBULATORY_CARE_PROVIDER_SITE_OTHER): Payer: Medicare Other | Admitting: Family Medicine

## 2023-07-04 ENCOUNTER — Ambulatory Visit
Admission: RE | Admit: 2023-07-04 | Discharge: 2023-07-04 | Disposition: A | Discharge status: Home / Self Care | Payer: Medicare Other | Source: Ambulatory Visit | Attending: Family Medicine | Admitting: Family Medicine

## 2023-07-04 ENCOUNTER — Encounter: Payer: Self-pay | Admitting: Family Medicine

## 2023-07-04 VITALS — BP 116/65 | HR 63 | Temp 97.7°F | Ht 62.0 in | Wt 115.0 lb

## 2023-07-04 DIAGNOSIS — T753XXA Motion sickness, initial encounter: Secondary | ICD-10-CM

## 2023-07-04 DIAGNOSIS — Z1231 Encounter for screening mammogram for malignant neoplasm of breast: Secondary | ICD-10-CM

## 2023-07-04 MED ORDER — MECLIZINE HCL 25 MG PO TABS: 25.0000 mg | ORAL_TABLET | Freq: Three times a day (TID) | ORAL | 0 refills | Status: DC | PRN

## 2023-07-04 MED ORDER — SCOPOLAMINE 1 MG/3DAYS TD PT72: 1.0000 | MEDICATED_PATCH | TRANSDERMAL | 0 refills | Status: DC

## 2023-07-04 NOTE — Patient Instructions (Signed)
It was very nice to see you today!  Ginger, pepermint oil(seasickness),  sea bands,  dramamine.    PLEASE NOTE:  If you had any lab tests please let us know if you have not heard back within a few days. You may see your results on MyChart before we have a chance to review them but we will give you a call once they are reviewed by [place]. If we ordered any referrals today, please let us know if you have not heard from their office within the next week.   Please try these tips to maintain a healthy lifestyle:  Eat most of your calories during the day when you are active. Eliminate processed foods including packaged sweets (pies, cakes, cookies), reduce intake of potatoes, white bread, white pasta, and white rice. Look for whole grain options, oat flour or almond flour.  Each meal should contain half fruits/vegetables, one quarter protein, and one quarter carbs (no bigger than a computer mouse).  Cut down on sweet beverages. This includes juice, soda, and sweet tea. Also watch fruit intake, though this is a healthier sweet option, it still contains natural sugar! Limit to 3 servings daily.  Drink at least 1 glass of water with each meal and aim for at least 8 glasses per day  Exercise at least 150 minutes every week.

## 2023-07-04 NOTE — Progress Notes (Signed)
Subjective:     Patient ID: Monique Moore, female    DOB: 1956/05/23, 67 y.o.   MRN: [id number]  Chief Complaint  Patient presents with   Medication Consultation    Rx for sea sickness  Requesting written Rx     HPI  Sea-sickness - Pt is going on a 2 week cruise to [place] in January and requests Rx for sea-sickness. The cruise will be passing through [place], where rough seas are to be expected. Has been on cruises previously with no previous issues. She has tried crystallized ginger as well.   Fmhx of glaucoma - Maternal grandma had glaucoma. Mother passed at 61 y/o, had no glaucoma. Visits eye doctor every 2 years and has upcoming appt.   Health Maintenance Due  Topic Date Due   MAMMOGRAM  06/19/2023   Colonoscopy  06/19/2023    Past Medical History:  Diagnosis Date   Endometriosis 2000   endometriosis related, pain with intercourse   IBS (irritable bowel syndrome)    ? improved with diet changes    Past Surgical History:  Procedure Laterality Date   ABDOMINAL HYSTERECTOMY  2003   benign, cervix included   FOOT SURGERY     Dr. Lestine Box [facility]   HEMORRHOID SURGERY     growth on hemorrhoid-benign   LAPAROTOMY  11/00   eval endometriosis   OOPHORECTOMY     ORIF ELBOW FRACTURE  2011   performed in [place], hiking   STAPEDECTOMY  2003   otosclerosis L     Current Outpatient Medications:    cetirizine (ZYRTEC) 10 MG tablet, Take 1 tablet (10 mg total) by mouth daily., Disp: 90 tablet, Rfl: 3   Cyanocobalamin (VITAMIN B12 PO), Take by mouth., Disp: , Rfl:    fluticasone (FLONASE) 50 MCG/ACT nasal spray, Place into both nostrils daily., Disp: , Rfl:    meclizine (ANTIVERT) 25 MG tablet, Take 1 tablet (25 mg total) by mouth 3 (three) times daily as needed for dizziness., Disp: 15 tablet, Rfl: 0   Multiple Vitamin (MULTIVITAMIN) tablet, Take 1 tablet by mouth daily., Disp: , Rfl:    scopolamine (TRANSDERM-SCOP) 1 MG/3DAYS, Place 1 patch (1.5 mg  total) onto the skin every 3 (three) days., Disp: 4 patch, Rfl: 0   VITAMIN D PO, Take by mouth., Disp: , Rfl:    Zoledronic Acid (RECLAST IV), Inject into the vein., Disp: , Rfl:   Current Facility-Administered Medications:    zoledronic acid (RECLAST) injection 5 mg, 5 mg, Intravenous, Once, Hunter, Aldine Contes, MD  Allergies  Allergen Reactions   Codeine Nausea And Vomiting   ROS neg/noncontributory except as noted HPI/below      Objective:     BP 116/65   Pulse 63   Temp 97.7 F (36.5 C) (Temporal)   Ht 5\' 2"  (1.575 m)   Wt 115 lb (52.2 kg)   SpO2 99%   BMI 21.03 kg/m  Wt Readings from Last 3 Encounters:  07/04/23 115 lb (52.2 kg)  04/24/23 112 lb (50.8 kg)  06/07/22 118 lb 12.8 oz (53.9 kg)    Physical Exam   Gen: WDWN NAD HEENT: NCAT, conjunctiva not injected, sclera nonicteric CARDIAC: RRR, S1S2+, no murmur.  LUNGS: CTAB. No wheezes MSK: no gross abnormalities.  NEURO: A&O x3.  CN II-XII intact.  PSYCH: normal mood. Good eye contact     Assessment & Plan:  Motion sickness, initial encounter  Other orders -     Scopolamine; Place 1 patch (  1.5 mg total) onto the skin every 3 (three) days.  Dispense: 4 patch; Refill: 0 -     Meclizine HCl; Take 1 tablet (25 mg total) by mouth 3 (three) times daily as needed for dizziness.  Dispense: 15 tablet; Refill: 0  1  motion sickness-prevention.  Discussed risk/benefits of meds. Will do transderm scop.  Also, meclizine if chooses that.  Ideally, sea bands, ginger, peppermint oil as age >39 but pt very healthy  Return if symptoms worsen or fail to improve.  I, Isabelle Course, acting as a scribe for Angelena Sole, MD., have documented all relevant documentation on the behalf of Angelena Sole, MD, as directed by  Angelena Sole, MD while in the presence of Angelena Sole, MD.  I, Angelena Sole, MD, have reviewed all documentation for this visit. The documentation on 07/04/23 for the exam, diagnosis, procedures, and orders are all  accurate and complete.  Angelena Sole, MD

## 2023-07-05 ENCOUNTER — Encounter: Payer: Self-pay | Admitting: Family Medicine

## 2023-07-06 ENCOUNTER — Encounter: Payer: Self-pay | Admitting: Family Medicine

## 2023-07-19 ENCOUNTER — Encounter: Payer: Self-pay | Admitting: Family Medicine

## 2023-08-08 LAB — BASIC METABOLIC PANEL
BUN: 13 (ref 4–21)
CO2: 20 (ref 13–22)
Chloride: 105 (ref 99–108)
Creatinine: 1 (ref 0.5–1.1)
Glucose: 105
Potassium: 4.6 meq/L (ref 3.5–5.1)
Sodium: 142 (ref 137–147)

## 2023-08-08 LAB — LIPID PANEL
HDL: 101 — AB (ref 35–70)
LDL Cholesterol: 114
Triglycerides: 98 (ref 40–160)

## 2023-08-08 LAB — CBC AND DIFFERENTIAL
HCT: 42 (ref 36–46)
Hemoglobin: 13.7 (ref 12.0–16.0)
Platelets: 299 10*3/uL (ref 150–400)
WBC: 7.5

## 2023-08-08 LAB — COMPREHENSIVE METABOLIC PANEL
Calcium: 9.4 (ref 8.7–10.7)
eGFR: 63

## 2023-08-08 LAB — HEMOGLOBIN A1C: Hemoglobin A1C: 5.9

## 2023-08-08 LAB — CBC: RBC: 4.55 (ref 3.87–5.11)

## 2023-08-08 LAB — VITAMIN D 25 HYDROXY (VIT D DEFICIENCY, FRACTURES): Vit D, 25-Hydroxy: 41.5

## 2023-08-20 ENCOUNTER — Encounter: Payer: Self-pay | Admitting: Family Medicine

## 2023-10-11 ENCOUNTER — Encounter: Payer: Self-pay | Admitting: Family Medicine

## 2023-10-11 ENCOUNTER — Ambulatory Visit: Payer: Medicare Other | Admitting: Family Medicine

## 2023-10-11 VITALS — BP 122/73 | HR 74 | Temp 97.8°F | Ht 62.0 in

## 2023-10-11 DIAGNOSIS — M2170 Unequal limb length (acquired), unspecified site: Secondary | ICD-10-CM

## 2023-10-11 DIAGNOSIS — M810 Age-related osteoporosis without current pathological fracture: Secondary | ICD-10-CM | POA: Diagnosis not present

## 2023-10-11 DIAGNOSIS — M8580 Other specified disorders of bone density and structure, unspecified site: Secondary | ICD-10-CM

## 2023-10-11 DIAGNOSIS — R7303 Prediabetes: Secondary | ICD-10-CM | POA: Diagnosis not present

## 2023-10-11 NOTE — Patient Instructions (Addendum)
Thanks for sending [place] copy of labs  -we discussed trying magnesium glycinate OTC (available over the counter without a prescription) perhaps once a week and increase if needed- particularly take if lower body feeling tighter/harder workout  If you would like to be evaluated further for rib pain- we can either order rib films or have you see Dr. Terrilee Files or Dr. Aleen Sells with [facility] -working with massage therapist also reasonable first step  Recommended follow up: Return in about 1 year (around 10/10/2024) for followup or sooner if needed.Schedule b4 you leave.

## 2023-10-11 NOTE — Progress Notes (Signed)
Phone [phone] In person visit   Subjective:   Monique Moore is a 68 y.o. year old very pleasant female patient who presents for/with See problem oriented charting Chief Complaint  Patient presents with   Annual Exam    Pt here for annual exam -    Leg Pain    Pt c/o of left lower leg/cramp pain - on and off for 1 year - pt concerned with it becoming worse - has tried Magnesium - helped some    Flank Pain   Past Medical History-  Patient Active Problem List   Diagnosis Date Noted   Hyperglycemia 08/12/2014    Priority: Medium    Thumb swelling 07/12/2014    Priority: Medium    Osteoporosis 08/12/2014    Priority: Low   Allergic rhinitis 08/12/2014    Priority: Low   MRI >1.5 T contraindicated due to metal implant 07/16/2014    Priority: Low   Conductive hearing loss of left ear with unrestricted hearing of right ear 03/15/2022   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 90 tablet 3   Cyanocobalamin (VITAMIN B12 PO) Take by mouth.     fluticasone (FLONASE) 50 MCG/ACT nasal spray Place into both nostrils daily. As needed     magnesium oxide (MAG-OX) 400 MG tablet Take by mouth.     Multiple Vitamin (MULTIVITAMIN) tablet Take 1 tablet by mouth daily.     VITAMIN D PO Take by mouth.     Zoledronic Acid (RECLAST IV) Inject into the vein.     meclizine (ANTIVERT) 25 MG tablet Take 1 tablet (25 mg total) by mouth 3 (three) times daily as needed for dizziness. (Patient not taking: Reported on 10/11/2023) 15 tablet 0   scopolamine (TRANSDERM-SCOP) 1 MG/3DAYS  Place 1 patch (1.5 mg total) onto the skin every 3 (three) days. (Patient not taking: Reported on 10/11/2023) 4 patch 0   Current Facility-Administered Medications  Medication Dose Route Frequency Provider Last Rate Last Admin   zoledronic acid (RECLAST) injection 5 mg  5 mg Intravenous Once Shelva Majestic, MD         Objective:  BP 122/73   Pulse 74   Temp 97.8 F (36.6 C)   Ht 5\' 2"  (1.575 m)   SpO2 98%   BMI 21.03 kg/m  Gen: NAD, resting comfortably CV: RRR no murmurs rubs or gallops Lungs: CTAB no crackles, wheeze, rhonchi Tender along right lower rib- several positions- musculature above and below not as tender Abdomen: soft/nontender/nondistended/normal bowel sounds. No rebound or guarding.  Ext: no edema Skin: warm, dry Neuro: grossly normal, moves all extremities     Assessment and Plan   #social update- just had great cruise to [place]- weight up slightly. Meclizine and patch on list as a result. Going to [place] so will on list for another cruise  #Left lower leg cramp/pain- noted off and on for 1 year- seems to be worsening. Magnesium helps some. On 25 day cruise- 3x woke her up from sleep- didnt have  magnesium and seemed to be worse. Stretches calf but doesn't always work- can get contracted- ahs to get up to walk it off. Worse on the longer leg with leg length discrepancy (diagnosed by Dr. Darrick Penna in the past)- random no obvious triggers. Occasional on right but much less.  -takes magnesium oxide only if getting cramps at 250 mg- usually in the morning -we discussed trying magnesium glycinate OTC (available over the counter without a prescription) perhaps once a week and increase if needed- particularly take if lower body feeling tighter/harder workout  #Right thoracic back pain- just underneath ribs gets a "stitch". Does not correspond to leg cramps. Tries to sit up fairly straight. Notices at least once a week- lasts all day. No shortness of breath and no  exertional element- doesn't worsen with exercise.  -still does yoga (gets core strengthening with this and with weights) and massages and ellipitical and weights -If you would like to be evaluated further for rib pain- we can either order rib films or have you see Dr. Terrilee Files or Dr. Aleen Sells with [facility] -working with massage therapist also reasonable first step  #hyperlipidemia mild S: Medication: none  Lab Results  Component Value Date   CHOL 186 07/04/2022   HDL 101 (A) 08/08/2023   LDLCALC 114 08/08/2023   LDLDIRECT 95.0 06/11/2013   TRIG 98 08/08/2023   CHOLHDL 2 05/03/2018   A/P: calculated 10 year arteriosclerotic cardiovascular disease risk using 100 for HDL and risk at 5.5%- we discussed option CT calcium scoring but jointly opted to hold off for now - can reconsider in future or if risk increases   # Osteoporosis- levels improved on last check S: Last DEXA: July 2023 and due this July   Medication (bisphosphonate or prolia): reclast through Dr. Huntley Dec. Planned tomorrow  Calcium: 1200mg  (through diet ok) recommended - does through diet Vitamin D: 1000 units a day recommended- takes this Last vitamin D Lab Results  Component Value Date   VD25OH 41.5 08/08/2023  A/P: hopefully stable- continue current medications  and DEXA later this year  # Hyperglycemia/insulin resistance/prediabetes S:  Medication: none other than some cinnamon Exercise and diet- continues to exercise regularly and try to eat well Lab Results  Component Value Date   HGBA1C 5.9 08/08/2023   HGBA1C 6.0 07/04/2022   HGBA1C 5.9 06/11/2021  A/P: thankfully no progression /mildly improved- continue to monitor   #Health maintenance - mammogram 07/04/2023 with 1 year repeat -colonoscopy with 5 year repeat- from 2024- may do with [facility] next time- going to [place] not convenient for driver -up to date on vaccines  Recommended follow up: Return in about 1 year (around 10/10/2024) for  followup or sooner if needed.Schedule b4 you leave. Future Appointments  Date Time Provider Department Center  04/25/2024  2:20 PM [facility] ANNUAL WELLNESS VISIT 1 [facility]    Lab/Order associations:   ICD-10-CM   1. Prediabetes  R73.03     2. Age-related osteoporosis without current pathological fracture  M81.0     3. Leg length discrepancy  M21.70     4. Osteopenia, unspecified location  M85.80       Time Spent: 31 minutes of total time (10:00 am- 10:31 AM) was spent on the date of the encounter performing the following actions: chart review prior to seeing the patient, obtaining history, performing a medically necessary exam, counseling on the treatment plan including talking about cramping and subtypes of magnesium, and documenting in our EHR.  Return precautions advised.  Tana Conch, MD

## 2023-10-12 ENCOUNTER — Encounter: Payer: Self-pay | Admitting: Family Medicine

## 2024-04-25 ENCOUNTER — Ambulatory Visit: Payer: Medicare Other

## 2024-04-25 ENCOUNTER — Encounter: Payer: Self-pay | Admitting: Family Medicine

## 2024-04-25 VITALS — Ht 62.0 in | Wt 115.0 lb

## 2024-04-25 DIAGNOSIS — Z0000 Encounter for general adult medical examination without abnormal findings: Secondary | ICD-10-CM

## 2024-04-25 NOTE — Patient Instructions (Signed)
 Monique Moore , Thank you for taking time out of your busy schedule to complete your Annual Wellness Visit with me. I enjoyed our conversation and look forward to speaking with you again next year. I, as well as your care team,  appreciate your ongoing commitment to your health goals. Please review the following plan we discussed and let me know if I can assist you in the future. Your Game plan/ To Do List    Referrals: If you haven't heard from the office you've been referred to, please reach out to them at the phone provided.   Follow up Visits: We will see or speak with you next year for your Next Medicare AWV with our clinical staff Have you seen your provider in the last 6 months (3 months if uncontrolled diabetes)? Yes  Clinician Recommendations:  Aim for 30 minutes of exercise or brisk walking, 6-8 glasses of water, and 5 servings of fruits and vegetables each day.       This is a list of the screenings recommended for you:  Health Maintenance  Topic Date Due   COVID-19 Vaccine (13 - 2024-25 season) 01/15/2024   Medicare Annual Wellness Visit  04/23/2024   Mammogram  07/03/2024   Colon Cancer Screening  07/05/2028   DTaP/Tdap/Td vaccine (4 - Td or Tdap) 03/08/2030   Pneumococcal Vaccine for age over 79  Completed   Flu Shot  Completed   DEXA scan (bone density measurement)  Completed   Hepatitis C Screening  Completed   Zoster (Shingles) Vaccine  Completed   HPV Vaccine  Aged Out   Meningitis B Vaccine  Aged Out    Advanced directives: (Copy Requested) Please bring a copy of your health care power of attorney and living will to the office to be added to your chart at your convenience. You can mail to [facility] [address] [place], [place] [address] or email to [email or web address] Advance Care Planning is important because it:  [x]  Makes sure you receive the medical care that is consistent with your values, goals, and preferences  [x]  It provides  guidance to your family and loved ones and reduces their decisional burden about whether or not they are making the right decisions based on your wishes.  Follow the link provided in your after visit summary or read over the paperwork we have mailed to you to help you started getting your Advance Directives in place. If you need assistance in completing these, please reach out to us  so that we can help you!  See attachments for Preventive Care and Fall Prevention Tips.

## 2024-04-25 NOTE — Progress Notes (Signed)
 Subjective:   Monique Moore is a 68 y.o. who presents for a Medicare Wellness preventive visit.  As a reminder, Annual Wellness Visits don't include a physical exam, and some assessments may be limited, especially if this visit is performed virtually. We may recommend an in-person follow-up visit with your provider if needed.  Visit Complete: Virtual I connected with  Monique Moore on 04/25/24 by a audio enabled telemedicine application and verified that I am speaking with the correct person using two identifiers.  Patient Location: Home  Provider Location: Office/Clinic  I discussed the limitations of evaluation and management by telemedicine. The patient expressed understanding and agreed to proceed.  Vital Signs: Because this visit was a virtual/telehealth visit, some criteria may be missing or patient reported. Any vitals not documented were not able to be obtained and vitals that have been documented are patient reported.  VideoDeclined- This patient declined Librarian, academic. Therefore the visit was completed with audio only.  Persons Participating in Visit: Patient.  AWV Questionnaire: Yes: Patient Medicare AWV questionnaire was completed by the patient on 04/21/24; I have confirmed that all information answered by patient is correct and no changes since this date.  Cardiac Risk Factors include: advanced age (>46men, >84 women)     Objective:    Today's Vitals   04/25/24 1422  Weight: 115 lb (52.2 kg)  Height: 5' 2 (1.575 m)   Body mass index is 21.03 kg/m.     04/25/2024    2:26 PM 04/24/2023    2:48 PM 04/12/2022    9:21 AM  Advanced Directives  Does Patient Have a Medical Advance Directive? Yes No Yes  Type of Estate agent of [place];Living will  Healthcare Power of [place];Living will  Copy of Healthcare Power of Attorney in Chart? No - copy requested  No - copy requested  Would patient like  information on creating a medical advance directive?  No - Patient declined     Current Medications (verified) Outpatient Encounter Medications as of 04/25/2024  Medication Sig   Ascorbic Acid (VITAMIN C) 100 MG CHEW    Biotin 1 MG CAPS    cetirizine  (ZYRTEC ) 10 MG tablet Take 1 tablet (10 mg total) by mouth daily.   Cyanocobalamin (VITAMIN B12 PO) Take by mouth.   fluticasone  (FLONASE ) 50 MCG/ACT nasal spray Place into both nostrils daily. As needed   magnesium oxide (MAG-OX) 400 MG tablet Take by mouth.   Multiple Vitamin (MULTIVITAMIN) tablet Take 1 tablet by mouth daily.   VITAMIN D  PO Take by mouth.   Zoledronic  Acid (RECLAST  IV) Inject into the vein.   [DISCONTINUED] meclizine  (ANTIVERT ) 25 MG tablet Take 1 tablet (25 mg total) by mouth 3 (three) times daily as needed for dizziness. (Patient not taking: Reported on 10/11/2023)   [DISCONTINUED] scopolamine  (TRANSDERM-SCOP) 1 MG/3DAYS Place 1 patch (1.5 mg total) onto the skin every 3 (three) days. (Patient not taking: Reported on 10/11/2023)   Facility-Administered Encounter Medications as of 04/25/2024  Medication   zoledronic  acid (RECLAST ) injection 5 mg    Allergies (verified) Codeine   History: Past Medical History:  Diagnosis Date   Endometriosis 2000   endometriosis related, pain with intercourse   IBS (irritable bowel syndrome)    ? improved with diet changes   Past Surgical History:  Procedure Laterality Date   ABDOMINAL HYSTERECTOMY  2003   benign, cervix included   FOOT SURGERY     [facility]  HEMORRHOID SURGERY     growth on hemorrhoid-benign   LAPAROTOMY  11/00   eval endometriosis   OOPHORECTOMY     ORIF ELBOW FRACTURE  2011   performed in [place], hiking   STAPEDECTOMY  2003   otosclerosis L   Family History  Problem Relation Age of Onset   Atrial fibrillation Mother    Hypertension Mother    Arthritis Mother    Osteoporosis Mother    Cerebral aneurysm Father        hemorrhage    Diabetes Father    Hypertension Brother    Diabetes Brother    Breast cancer Neg Hx    Social History   Socioeconomic History   Marital status: Married    Spouse name: Not on file   Number of children: Not on file   Years of education: Not on file   Highest education level: Bachelor's degree (e.g., BA, AB, BS)  Occupational History   Not on file  Tobacco Use   Smoking status: Never   Smokeless tobacco: Never  Substance and Sexual Activity   Alcohol use: Yes    Comment: with dinner   Drug use: No   Sexual activity: Not on file  Other Topics Concern   Not on file  Social History Narrative   Married 1979. 2 children (son '85 and daughter '88). 1 grandson '15.       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       Hobbies: paint, water ski, travel   Social Drivers of Health   Financial Resource Strain: Low Risk  (04/25/2024)   Overall Financial Resource Strain (CARDIA)    Difficulty of Paying Living Expenses: Not hard at all  Food Insecurity: No Food Insecurity (04/25/2024)   Hunger Vital Sign    Worried About Running Out of Food in the Last Year: Never true    Ran Out of Food in the Last Year: Never true  Transportation Needs: No Transportation Needs (04/25/2024)   PRAPARE - Administrator, Civil Service (Medical): No    Lack of Transportation (Non-Medical): No  Physical Activity: Sufficiently Active (04/25/2024)   Exercise Vital Sign    Days of Exercise per Week: 7 days    Minutes of Exercise per Session: 40 min  Stress: No Stress Concern Present (04/25/2024)   [employer] of Occupational Health - Occupational Stress Questionnaire    Feeling of Stress: Not at all  Social Connections: Moderately Integrated (04/25/2024)   Social Connection and Isolation Panel    Frequency of Communication with Friends and Family: More than three times a week    Frequency of Social Gatherings with Friends and Family: More than three times a week     Attends Religious Services: Never    Database administrator or Organizations: Yes    Attends Engineer, structural: 1 to 4 times per year    Marital Status: Married    Tobacco Counseling Counseling given: Not Answered    Clinical Intake:  Pre-visit preparation completed: Yes  Pain : No/denies pain     BMI - recorded: 21.03 Nutritional Status: BMI of 19-24  Normal Nutritional Risks: None Diabetes: No  Lab Results  Component Value Date   HGBA1C 5.9 08/08/2023   HGBA1C 6.0 07/04/2022   HGBA1C 5.9 06/11/2021     How often do you need to have someone help you when you read instructions, pamphlets, or other written materials from your doctor or pharmacy?: 1 -  Never  Interpreter Needed?: No  Information entered by :: Ellouise Haws, LPN   Activities of Daily Living     04/21/2024    9:03 AM  In your present state of health, do you have any difficulty performing the following activities:  Hearing? 1  Comment left ear hearing aids  Vision? 0  Difficulty concentrating or making decisions? 0  Walking or climbing stairs? 0  Dressing or bathing? 0  Doing errands, shopping? 0  Preparing Food and eating ? N  Using the Toilet? N  In the past six months, have you accidently leaked urine? N  Do you have problems with loss of bowel control? N  Managing your Medications? N  Managing your Finances? N  Housekeeping or managing your Housekeeping? N    Patient Care Team: Katrinka Garnette KIDD, MD as PCP - General (Family Medicine) Octavia Bruckner, MD as Consulting Physician (Ophthalmology)  I have updated your Care Teams any recent Medical Services you may have received from other providers in the past year.     Assessment:   This is a routine wellness examination for Monique Moore.  Hearing/Vision screen Hearing Screening - Comments:: Left ear hearing aid  Vision Screening - Comments:: Wears rx glasses - up to date with routine eye exams with Dr Bruckner Octavia     Goals Addressed   None    Depression Screen     04/25/2024    2:24 PM 07/04/2023    9:04 AM 04/24/2023    2:52 PM 04/12/2022    9:20 AM 04/09/2021    3:38 PM 01/04/2021    1:54 PM 05/03/2018    8:26 AM  PHQ 2/9 Scores  PHQ - 2 Score 0 0 0 0 0 0 0  PHQ- 9 Score      0     Fall Risk     04/21/2024    9:03 AM 07/04/2023    9:04 AM 04/21/2023    3:24 PM 04/12/2022    9:22 AM 04/09/2021    3:38 PM  Fall Risk   Falls in the past year? 0 0 0 0 0  Number falls in past yr:  0 0 0 0  Injury with Fall?  0 0 0 0  Risk for fall due to : No Fall Risks No Fall Risks No Fall Risks Impaired vision No Fall Risks  Follow up Falls prevention discussed  Falls prevention discussed Falls prevention discussed  Falls evaluation completed      Data saved with a previous flowsheet row definition    MEDICARE RISK AT HOME:  Medicare Risk at Home Any stairs in or around the home?: (Patient-Rptd) Yes If so, are there any without handrails?: (Patient-Rptd) No Home free of loose throw rugs in walkways, pet beds, electrical cords, etc?: (Patient-Rptd) Yes Adequate lighting in your home to reduce risk of falls?: (Patient-Rptd) Yes Life alert?: (Patient-Rptd) No Use of a cane, walker or w/c?: (Patient-Rptd) No Grab bars in the bathroom?: (Patient-Rptd) No Shower chair or bench in shower?: (Patient-Rptd) Yes Elevated toilet seat or a handicapped toilet?: (Patient-Rptd) No  TIMED UP AND GO:  Was the test performed?  No  Cognitive Function: 6CIT completed        04/25/2024    2:27 PM 04/24/2023    2:51 PM 04/12/2022    9:27 AM  6CIT Screen  What Year? 0 points 0 points 0 points  What month? 0 points 0 points 0 points  What time? 0 points 0 points 0  points  Count back from 20 0 points 0 points 0 points  Months in reverse 0 points 0 points 0 points  Repeat phrase 0 points 0 points 0 points  Total Score 0 points 0 points 0 points    Immunizations Immunization History  Administered Date(s)  Administered   INFLUENZA, HIGH DOSE SEASONAL PF 05/13/2021, 04/22/2024   Influenza Split 06/10/2011, 05/23/2012   Influenza Whole 07/06/2007, 06/05/2008, 05/28/2009, 07/14/2010   Influenza,inj,Quad PF,6+ Mos 06/11/2013, 06/04/2014, 06/12/2015, 06/27/2016, 06/05/2017, 05/03/2018, 05/21/2019, 05/12/2020   Influenza-Unspecified 04/25/2019, 05/21/2019, 05/13/2021, 05/17/2022, 05/24/2023   Moderna Covid-19 Fall Seasonal Vaccine 59yrs & older 07/18/2023   Moderna Covid-19 Vaccine Bivalent Booster 8yrs & up 07/26/2021   Moderna SARS-COV2 Booster Vaccination 07/13/2022   Moderna Sars-Covid-2 Vaccination 11/06/2019, 12/13/2019, 06/21/2020, 02/04/2021   PNEUMOCOCCAL CONJUGATE-20 04/09/2021   Pneumococcal Conjugate,unspecified 04/09/2021   Pneumococcal-Unspecified 04/09/2021   Tdap 12/10/2009, 02/18/2020, 03/08/2020   Unspecified SARS-COV-2 Vaccination 11/06/2019, 12/13/2019, 06/21/2020, 02/04/2021, 07/26/2021   Yellow Fever 06/23/2022   Zoster Recombinant(Shingrix ) 05/03/2018, 07/10/2018   Zoster, Live 05/03/2018, 07/10/2018    Screening Tests Health Maintenance  Topic Date Due   COVID-19 Vaccine (13 - 2024-25 season) 01/15/2024   MAMMOGRAM  07/03/2024   Medicare Annual Wellness (AWV)  04/25/2025   Colonoscopy  07/05/2028   DTaP/Tdap/Td (4 - Td or Tdap) 03/08/2030   Pneumococcal Vaccine: 50+ Years  Completed   INFLUENZA VACCINE  Completed   DEXA SCAN  Completed   Hepatitis C Screening  Completed   Zoster Vaccines- Shingrix   Completed   HPV VACCINES  Aged Out   Meningococcal B Vaccine  Aged Out    Health Maintenance  Health Maintenance Due  Topic Date Due   COVID-19 Vaccine (13 - 2024-25 season) 01/15/2024   Health Maintenance Items Addressed: See Nurse Notes at the end of this note  Additional Screening:  Vision Screening: Recommended annual ophthalmology exams for early detection of glaucoma and other disorders of the eye. Would you like a referral to an eye doctor? No     Dental Screening: Recommended annual dental exams for proper oral hygiene  Community Resource Referral / Chronic Care Management: CRR required this visit?  No   CCM required this visit?  No   Plan:    I have personally reviewed and noted the following in the patient's chart:   Medical and social history Use of alcohol, tobacco or illicit drugs  Current medications and supplements including opioid prescriptions. Patient is not currently taking opioid prescriptions. Functional ability and status Nutritional status Physical activity Advanced directives List of other physicians Hospitalizations, surgeries, and ER visits in previous 12 months Vitals Screenings to include cognitive, depression, and falls Referrals and appointments  In addition, I have reviewed and discussed with patient certain preventive protocols, quality metrics, and best practice recommendations. A written personalized care plan for preventive services as well as general preventive health recommendations were provided to patient.   Ellouise VEAR Haws, LPN   [id number]   After Visit Summary: (MyChart) Due to this being a telephonic visit, the after visit summary with patients personalized plan was offered to patient via MyChart   Notes: Nothing significant to report at this time.

## 2024-06-03 ENCOUNTER — Other Ambulatory Visit: Payer: Self-pay | Admitting: Family Medicine

## 2024-06-03 DIAGNOSIS — Z1231 Encounter for screening mammogram for malignant neoplasm of breast: Secondary | ICD-10-CM

## 2024-06-10 ENCOUNTER — Encounter: Payer: Self-pay | Admitting: Family Medicine

## 2024-07-04 ENCOUNTER — Ambulatory Visit
Admission: RE | Admit: 2024-07-04 | Discharge: 2024-07-04 | Disposition: A | Source: Ambulatory Visit | Attending: Family Medicine | Admitting: Family Medicine

## 2024-07-04 DIAGNOSIS — Z1231 Encounter for screening mammogram for malignant neoplasm of breast: Secondary | ICD-10-CM

## 2024-07-18 ENCOUNTER — Telehealth: Admitting: Physician Assistant

## 2024-07-18 DIAGNOSIS — R3989 Other symptoms and signs involving the genitourinary system: Secondary | ICD-10-CM

## 2024-07-18 MED ORDER — CEPHALEXIN 500 MG PO CAPS: 500.0000 mg | ORAL_CAPSULE | Freq: Two times a day (BID) | ORAL | 0 refills | Status: AC

## 2024-07-18 NOTE — Progress Notes (Signed)

## 2024-09-20 ENCOUNTER — Ambulatory Visit: Admitting: Family

## 2024-09-20 ENCOUNTER — Ambulatory Visit

## 2024-09-20 VITALS — BP 112/70 | HR 86 | Temp 98.2°F | Ht 62.0 in | Wt 115.2 lb

## 2024-09-20 DIAGNOSIS — M7918 Myalgia, other site: Secondary | ICD-10-CM

## 2024-09-20 DIAGNOSIS — W19XXXA Unspecified fall, initial encounter: Secondary | ICD-10-CM | POA: Diagnosis not present

## 2024-09-20 DIAGNOSIS — S3993XA Unspecified injury of pelvis, initial encounter: Secondary | ICD-10-CM | POA: Diagnosis not present

## 2024-09-20 DIAGNOSIS — M8589 Other specified disorders of bone density and structure, multiple sites: Secondary | ICD-10-CM | POA: Diagnosis not present
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
## 2024-09-23 ENCOUNTER — Ambulatory Visit: Payer: Self-pay | Admitting: Family

## 2024-10-17 ENCOUNTER — Ambulatory Visit: Payer: Medicare Other | Admitting: Family Medicine
